# Patient Record
Sex: Female | Born: 1970 | Race: White | Hispanic: No | Marital: Single
Health system: Midwestern US, Academic
[De-identification: ages and names within clinical notes are randomized; demographics above are authoritative.]

---

## 2018-05-13 ENCOUNTER — Inpatient Hospital Stay: Admit: 2018-05-13 | Discharge: 2018-05-13 | Payer: MEDICARE | Primary: Family

## 2018-05-13 ENCOUNTER — Encounter: Admit: 2018-05-13 | Discharge: 2018-05-14 | Primary: Family

## 2018-05-13 ENCOUNTER — Encounter: Admit: 2018-05-13 | Discharge: 2018-05-13 | Payer: MEDICARE | Primary: Family

## 2018-05-13 DIAGNOSIS — N3000 Acute cystitis without hematuria: ICD-10-CM

## 2018-05-13 DIAGNOSIS — N390 Urinary tract infection, site not specified: ICD-10-CM

## 2018-05-13 DIAGNOSIS — F319 Bipolar disorder, unspecified: ICD-10-CM

## 2018-05-13 DIAGNOSIS — I701 Atherosclerosis of renal artery: ICD-10-CM

## 2018-05-13 DIAGNOSIS — R918 Other nonspecific abnormal finding of lung field: Principal | ICD-10-CM

## 2018-05-13 DIAGNOSIS — F329 Major depressive disorder, single episode, unspecified: ICD-10-CM

## 2018-05-13 DIAGNOSIS — I10 Essential (primary) hypertension: ICD-10-CM

## 2018-05-13 DIAGNOSIS — G35 Multiple sclerosis: ICD-10-CM

## 2018-05-13 DIAGNOSIS — E785 Hyperlipidemia, unspecified: ICD-10-CM

## 2018-05-13 MED ORDER — LACTATED RINGERS IV SOLP
1000 mL | INTRAVENOUS | 0 refills | Status: CP
Start: 2018-05-13 — End: ?
  Administered 2018-05-14: 03:00:00 1000 mL via INTRAVENOUS

## 2018-05-13 MED ORDER — SULFAMETHOXAZOLE-TRIMETHOPRIM 800-160 MG PO TAB
1 | Freq: Two times a day (BID) | ORAL | 0 refills | Status: DC
Start: 2018-05-13 — End: 2018-05-14
  Administered 2018-05-14: 04:00:00 1 via ORAL

## 2018-05-13 MED ORDER — NICARDIPINE IN NACL (ISO-OS) 20 MG/200 ML IV PGBK
5-15 mg/h | INTRAVENOUS | 0 refills | Status: DC
Start: 2018-05-13 — End: 2018-05-15
  Administered 2018-05-14 (×3): 5 mg/h via INTRAVENOUS
  Administered 2018-05-14: 16:00:00 7.5 mg/h via INTRAVENOUS
  Administered 2018-05-14 – 2018-05-15 (×3): 5 mg/h via INTRAVENOUS

## 2018-05-13 MED ORDER — ENOXAPARIN 40 MG/0.4 ML SC SYRG
40 mg | Freq: Every day | SUBCUTANEOUS | 0 refills | Status: DC
Start: 2018-05-13 — End: 2018-05-14
  Administered 2018-05-14: 13:00:00 40 mg via SUBCUTANEOUS

## 2018-05-14 DIAGNOSIS — N179 Acute kidney failure, unspecified: Secondary | ICD-10-CM

## 2018-05-14 LAB — CBC AND DIFF
Lab: 11 10*3/uL — ABNORMAL HIGH (ref 4.5–11.0)
Lab: 10 K/UL (ref >60)

## 2018-05-14 LAB — URINALYSIS DIPSTICK REFLEX TO CULTURE: Lab: NEGATIVE K/UL (ref 0–0.45)

## 2018-05-14 LAB — MAGNESIUM
Lab: 1.6 mg/dL — ABNORMAL HIGH (ref 1.6–2.6)
Lab: 1.9 mg/dL — ABNORMAL HIGH (ref 1.6–2.6)

## 2018-05-14 LAB — COMPREHENSIVE METABOLIC PANEL
Lab: 147 MMOL/L (ref 137–147)
Lab: 41 mg/dL — ABNORMAL HIGH (ref 7–25)
Lab: 147 MMOL/L — ABNORMAL HIGH (ref 137–147)

## 2018-05-14 LAB — TROPONIN-I
Lab: 0.3 ng/mL — ABNORMAL HIGH (ref 0.0–0.05)
Lab: 0.3 ng/mL — ABNORMAL HIGH (ref 0.0–0.05)

## 2018-05-14 LAB — URINALYSIS MICROSCOPIC REFLEX TO CULTURE

## 2018-05-14 LAB — CREATINE KINASE-CPK: Lab: 398 U/L — ABNORMAL HIGH (ref 21–215)

## 2018-05-14 LAB — LACTIC ACID (BG - RAPID LACTATE): Lab: 1.7 MMOL/L (ref 0.5–2.0)

## 2018-05-14 MED ORDER — CARVEDILOL 12.5 MG PO TAB
12.5 mg | Freq: Two times a day (BID) | ORAL | 0 refills | Status: DC
Start: 2018-05-14 — End: 2018-05-15
  Administered 2018-05-14: 22:00:00 12.5 mg via ORAL

## 2018-05-14 MED ORDER — DIPHENHYDRAMINE HCL 25 MG PO CAP
25 mg | ORAL | 0 refills | Status: DC | PRN
Start: 2018-05-14 — End: 2018-05-22
  Administered 2018-05-14 – 2018-05-22 (×3): 25 mg via ORAL

## 2018-05-14 MED ORDER — HEPARIN, PORCINE (PF) 5,000 UNIT/0.5 ML IJ SYRG
5000 [IU] | SUBCUTANEOUS | 0 refills | Status: DC
Start: 2018-05-14 — End: 2018-05-22
  Administered 2018-05-14 – 2018-05-21 (×19): 5000 [IU] via SUBCUTANEOUS

## 2018-05-14 MED ORDER — AMLODIPINE 10 MG PO TAB
10 mg | Freq: Every day | ORAL | 0 refills | Status: DC
Start: 2018-05-14 — End: 2018-05-16
  Administered 2018-05-14 – 2018-05-16 (×3): 10 mg via ORAL

## 2018-05-14 MED ORDER — CLONIDINE HCL 0.1 MG PO TAB
.1 mg | ORAL | 0 refills | Status: DC
Start: 2018-05-14 — End: 2018-05-16
  Administered 2018-05-14 – 2018-05-16 (×6): 0.1 mg via ORAL

## 2018-05-14 MED ORDER — SULFAMETHOXAZOLE-TRIMETHOPRIM 400-80 MG PO TAB
1 | Freq: Two times a day (BID) | ORAL | 0 refills | Status: DC
Start: 2018-05-14 — End: 2018-05-15
  Administered 2018-05-15 (×2): 1 via ORAL

## 2018-05-15 DIAGNOSIS — N179 Acute kidney failure, unspecified: Secondary | ICD-10-CM

## 2018-05-15 LAB — CREATINE KINASE-CPK: Lab: 246 U/L — ABNORMAL HIGH (ref 21–215)

## 2018-05-15 LAB — MAGNESIUM: Lab: 2 mg/dL — ABNORMAL LOW (ref 1.6–2.6)

## 2018-05-15 LAB — CBC AND DIFF: Lab: 10 K/UL — ABNORMAL LOW (ref >60)

## 2018-05-15 LAB — COMPREHENSIVE METABOLIC PANEL: Lab: 138 MMOL/L — ABNORMAL LOW (ref >60)

## 2018-05-15 MED ORDER — LOSARTAN 25 MG PO TAB
50 mg | Freq: Once | ORAL | 0 refills | Status: CP
Start: 2018-05-15 — End: ?
  Administered 2018-05-15: 23:00:00 50 mg via ORAL

## 2018-05-15 MED ORDER — LOSARTAN 25 MG PO TAB
50 mg | Freq: Every day | ORAL | 0 refills | Status: DC
Start: 2018-05-15 — End: 2018-05-16
  Administered 2018-05-15: 15:00:00 50 mg via ORAL

## 2018-05-15 MED ORDER — CARVEDILOL 25 MG PO TAB
25 mg | Freq: Two times a day (BID) | ORAL | 0 refills | Status: DC
Start: 2018-05-15 — End: 2018-05-22
  Administered 2018-05-15 – 2018-05-22 (×14): 25 mg via ORAL

## 2018-05-15 MED ORDER — VANCOMYCIN  500 MG IVPB
500 mg | Freq: Once | INTRAVENOUS | 0 refills | Status: CP
Start: 2018-05-15 — End: ?
  Administered 2018-05-15 (×2): 500 mg via INTRAVENOUS

## 2018-05-16 LAB — CBC AND DIFF: Lab: 8.1 K/UL — ABNORMAL LOW (ref 4.5–11.0)

## 2018-05-16 LAB — BASIC METABOLIC PANEL
Lab: 112 MMOL/L — ABNORMAL HIGH (ref 98–110)
Lab: 119 mg/dL — ABNORMAL HIGH (ref >60)
Lab: 139 MMOL/L — ABNORMAL HIGH (ref 137–147)
Lab: 14 mL/min — ABNORMAL LOW (ref >60)
Lab: 19 MMOL/L — ABNORMAL LOW (ref 21–30)
Lab: 3.4 mg/dL — ABNORMAL HIGH (ref 0.4–1.00)
Lab: 4.8 MMOL/L (ref 3.5–5.1)
Lab: 66 mg/dL — ABNORMAL HIGH (ref 7–25)
Lab: 8 mL/min (ref >60)
Lab: 8.8 mg/dL (ref 8.5–10.6)

## 2018-05-16 LAB — COCAINE-URINE RANDOM: Lab: NEGATIVE

## 2018-05-16 LAB — MAGNESIUM: Lab: 2.3 mg/dL (ref 1.6–2.6)

## 2018-05-16 LAB — BENZODIAZEPINES-URINE RANDOM: Lab: NEGATIVE

## 2018-05-16 LAB — OPIATES-URINE RANDOM: Lab: NEGATIVE

## 2018-05-16 LAB — VANCOMYCIN TIMED LEVEL: Lab: 3.7 ug/mL

## 2018-05-16 LAB — AMPHETAMINES-URINE RANDOM: Lab: NEGATIVE

## 2018-05-16 LAB — BARBITURATES-URINE RANDOM: Lab: NEGATIVE — AB

## 2018-05-16 LAB — CANNABINOIDS-URINE RANDOM: Lab: NEGATIVE — AB

## 2018-05-16 LAB — PROLACTIN: Lab: 13 ng/mL (ref 3.3–26.7)

## 2018-05-16 LAB — COMPREHENSIVE METABOLIC PANEL: Lab: 141 MMOL/L (ref 137–147)

## 2018-05-16 LAB — LACTIC ACID(LACTATE): Lab: 0.6 MMOL/L (ref 0.5–2.0)

## 2018-05-16 LAB — PHENCYCLIDINES-URINE RANDOM: Lab: NEGATIVE

## 2018-05-16 MED ORDER — AMLODIPINE 10 MG PO TAB
10 mg | Freq: Two times a day (BID) | ORAL | 0 refills | Status: DC
Start: 2018-05-16 — End: 2018-05-23
  Administered 2018-05-17 – 2018-05-23 (×13): 10 mg via ORAL

## 2018-05-16 MED ORDER — DOXYCYCLINE HYCLATE 100 MG PO TAB
100 mg | Freq: Two times a day (BID) | ORAL | 0 refills | Status: DC
Start: 2018-05-16 — End: 2018-05-16

## 2018-05-16 MED ORDER — POLYETHYLENE GLYCOL 3350 17 GRAM PO PWPK
2 | Freq: Two times a day (BID) | ORAL | 0 refills | Status: DC
Start: 2018-05-16 — End: 2018-05-22
  Administered 2018-05-16 – 2018-05-18 (×3): 34 g via ORAL

## 2018-05-16 MED ORDER — CLONIDINE HCL 0.1 MG PO TAB
.1 mg | Freq: Once | ORAL | 0 refills | Status: CP
Start: 2018-05-16 — End: ?
  Administered 2018-05-16: 15:00:00 0.1 mg via ORAL

## 2018-05-16 MED ORDER — LACTATED RINGERS IV SOLP
1000 mL | INTRAVENOUS | 0 refills | Status: CP
Start: 2018-05-16 — End: ?
  Administered 2018-05-16: 15:00:00 1000 mL via INTRAVENOUS

## 2018-05-16 MED ORDER — NICARDIPINE IN NACL (ISO-OS) 20 MG/200 ML IV PGBK
5-15 mg/h | INTRAVENOUS | 0 refills | Status: DC
Start: 2018-05-16 — End: 2018-05-19
  Administered 2018-05-16 – 2018-05-17 (×7): 10 mg/h via INTRAVENOUS
  Administered 2018-05-17: 15:00:00 5 mg/h via INTRAVENOUS
  Administered 2018-05-17 (×2): 10 mg/h via INTRAVENOUS

## 2018-05-16 MED ORDER — CLONIDINE HCL 0.1 MG PO TAB
.3 mg | ORAL | 0 refills | Status: DC
Start: 2018-05-16 — End: 2018-05-23
  Administered 2018-05-17 – 2018-05-23 (×18): 0.3 mg via ORAL

## 2018-05-16 MED ORDER — SENNOSIDES 8.6 MG PO TAB
1 | Freq: Two times a day (BID) | ORAL | 0 refills | Status: DC
Start: 2018-05-16 — End: 2018-05-24
  Administered 2018-05-16 – 2018-05-24 (×5): 1 via ORAL

## 2018-05-16 MED ORDER — VANCOMYCIN 1G/250ML D5W IVPB (VIAL2BAG)
15 mg/kg | Freq: Once | INTRAVENOUS | 0 refills | Status: CP
Start: 2018-05-16 — End: ?
  Administered 2018-05-16 (×2): 1000 mg via INTRAVENOUS

## 2018-05-16 MED ORDER — VANCOMYCIN RANDOM DOSING
1 | INTRAVENOUS | 0 refills | Status: DC
Start: 2018-05-16 — End: 2018-05-24

## 2018-05-16 MED ORDER — CLONIDINE HCL 0.1 MG PO TAB
.2 mg | ORAL | 0 refills | Status: DC
Start: 2018-05-16 — End: 2018-05-16

## 2018-05-16 MED ORDER — LOSARTAN 25 MG PO TAB
100 mg | Freq: Every day | ORAL | 0 refills | Status: DC
Start: 2018-05-16 — End: 2018-05-22
  Administered 2018-05-16 – 2018-05-21 (×7): 100 mg via ORAL

## 2018-05-16 MED ORDER — PEG-ELECTROLYTE SOLN 420 GRAM PO SOLR
4 L | Freq: Once | ORAL | 0 refills | Status: CP
Start: 2018-05-16 — End: ?
  Administered 2018-05-17: 03:00:00 4 L via ORAL

## 2018-05-16 MED ORDER — VANCOMYCIN PHARMACY TO MANAGE
1 | 0 refills | Status: DC
Start: 2018-05-16 — End: 2018-05-24

## 2018-05-17 LAB — BLOOD GASES, PERIPHERAL VENOUS
Lab: 18 MMOL/L
Lab: 27 mmHg — ABNORMAL LOW (ref 36–50)
Lab: 7.3 (ref 7.30–7.40)
Lab: 7.7 MMOL/L
Lab: 81 mmHg — ABNORMAL HIGH (ref 33–48)
Lab: 96 % — ABNORMAL HIGH (ref 55–71)

## 2018-05-17 LAB — MAGNESIUM: Lab: 2.3 mg/dL — ABNORMAL HIGH (ref >60)

## 2018-05-17 LAB — CBC AND DIFF: Lab: 7.1 K/UL — ABNORMAL LOW (ref 4.5–11.0)

## 2018-05-17 LAB — COMPREHENSIVE METABOLIC PANEL: Lab: 140 MMOL/L — ABNORMAL LOW (ref >60)

## 2018-05-17 MED ORDER — LORAZEPAM 2 MG/ML IJ SOLN
.5-1 mg | Freq: Once | INTRAVENOUS | 0 refills | Status: CP
Start: 2018-05-17 — End: ?

## 2018-05-17 MED ADMIN — LORAZEPAM 2 MG/ML IJ SOLN [10467]: 1 mg | INTRAVENOUS | @ 17:00:00 | Stop: 2018-05-17 | NDC 00641604801

## 2018-05-18 ENCOUNTER — Inpatient Hospital Stay: Admit: 2018-05-18 | Discharge: 2018-05-18 | Payer: MEDICARE | Primary: Family

## 2018-05-18 LAB — BASIC METABOLIC PANEL: Lab: 146 MMOL/L — ABNORMAL LOW (ref 137–147)

## 2018-05-18 LAB — VANCOMYCIN TIMED LEVEL: Lab: 9.8 ug/mL (ref 0.4–1.24)

## 2018-05-18 LAB — PHOSPHORUS: Lab: 4 mg/dL — ABNORMAL LOW (ref 2.0–4.5)

## 2018-05-18 LAB — MAGNESIUM: Lab: 2.3 mg/dL — ABNORMAL LOW (ref 1.6–2.6)

## 2018-05-18 LAB — CBC: Lab: 5.6 K/UL — ABNORMAL LOW (ref 4.5–11.0)

## 2018-05-18 MED ORDER — VANCOMYCIN IN DEXTROSE 5 % 750 MG/150 ML IV PGBK
750 mg | Freq: Once | INTRAVENOUS | 0 refills | Status: CP
Start: 2018-05-18 — End: ?
  Administered 2018-05-18: 15:00:00 750 mg via INTRAVENOUS

## 2018-05-19 LAB — MAGNESIUM: Lab: 1.9 mg/dL — ABNORMAL LOW (ref 1.6–2.6)

## 2018-05-19 LAB — LIPID PROFILE
Lab: 128 mg/dL (ref <150)
Lab: 151 mg/dL — ABNORMAL LOW (ref <200)

## 2018-05-19 LAB — CULTURE-BLOOD W/SENSITIVITY

## 2018-05-19 LAB — CBC: Lab: 6.9 K/UL — ABNORMAL HIGH (ref 4.5–11.0)

## 2018-05-19 LAB — VANCOMYCIN TIMED LEVEL: Lab: 13 ug/mL

## 2018-05-19 LAB — PHOSPHORUS: Lab: 3.4 mg/dL — ABNORMAL LOW (ref 2.0–4.5)

## 2018-05-19 LAB — BASIC METABOLIC PANEL: Lab: 139 MMOL/L — ABNORMAL HIGH (ref 137–147)

## 2018-05-19 MED ORDER — DEXTROSE 5%-0.45% SODIUM CHLORIDE IV SOLP
INTRAVENOUS | 0 refills | Status: DC
Start: 2018-05-19 — End: 2018-05-21
  Administered 2018-05-21: 03:00:00 1000.000 mL via INTRAVENOUS

## 2018-05-19 MED ORDER — FLU VACC QS2019-20 6MOS UP(PF) 60 MCG (15 MCG X 4)/0.5 ML IM SYRG
.5 mL | Freq: Once | INTRAMUSCULAR | 0 refills | Status: CP
Start: 2018-05-19 — End: ?
  Administered 2018-05-19: 23:00:00 0.5 mL via INTRAMUSCULAR

## 2018-05-19 MED ORDER — IMS MIXTURE TEMPLATE
60 mg | Freq: Two times a day (BID) | ORAL | 0 refills | Status: CP
Start: 2018-05-19 — End: ?
  Administered 2018-05-21 (×4): 60 mg via ORAL

## 2018-05-19 MED ORDER — DIPHENHYDRAMINE HCL 25 MG PO CAP
25 mg | Freq: Once | ORAL | 0 refills | Status: CP
Start: 2018-05-19 — End: ?
  Administered 2018-05-21: 13:00:00 25 mg via ORAL

## 2018-05-19 MED ORDER — ATORVASTATIN 40 MG PO TAB
40 mg | Freq: Every evening | ORAL | 0 refills | Status: DC
Start: 2018-05-19 — End: 2018-05-24
  Administered 2018-05-20 – 2018-05-24 (×5): 40 mg via ORAL

## 2018-05-19 MED ORDER — PNEUMOCOCCAL 23-VAL PS VACCINE 25 MCG/0.5 ML IJ SOLN
.5 mL | Freq: Once | INTRAMUSCULAR | 0 refills | Status: DC
Start: 2018-05-19 — End: 2018-05-24

## 2018-05-20 LAB — MAGNESIUM: Lab: 2 mg/dL — ABNORMAL LOW (ref 1.6–2.6)

## 2018-05-20 LAB — VANCOMYCIN TIMED LEVEL: Lab: 10 ug/mL — ABNORMAL LOW (ref 4.0–5.0)

## 2018-05-20 LAB — BASIC METABOLIC PANEL: Lab: 140 MMOL/L — ABNORMAL LOW (ref 137–147)

## 2018-05-20 LAB — PHOSPHORUS: Lab: 3.4 mg/dL — ABNORMAL LOW (ref 2.0–4.5)

## 2018-05-20 LAB — CBC: Lab: 6.5 K/UL — ABNORMAL HIGH (ref 4.5–11.0)

## 2018-05-20 MED ORDER — VANCOMYCIN  500 MG IVPB
500 mg | Freq: Once | INTRAVENOUS | 0 refills | Status: CP
Start: 2018-05-20 — End: ?
  Administered 2018-05-20 (×2): 500 mg via INTRAVENOUS

## 2018-05-21 ENCOUNTER — Encounter: Admit: 2018-05-21 | Discharge: 2018-05-21 | Payer: MEDICARE | Primary: Family

## 2018-05-21 DIAGNOSIS — N179 Acute kidney failure, unspecified: Secondary | ICD-10-CM

## 2018-05-21 LAB — CBC: Lab: 5.3 K/UL — ABNORMAL HIGH (ref 4.5–11.0)

## 2018-05-21 LAB — PHOSPHORUS: Lab: 4.6 mg/dL — ABNORMAL HIGH (ref >60)

## 2018-05-21 LAB — MAGNESIUM: Lab: 2 mg/dL — ABNORMAL LOW (ref 1.6–2.6)

## 2018-05-21 LAB — BASIC METABOLIC PANEL: Lab: 135 MMOL/L — ABNORMAL LOW (ref 137–147)

## 2018-05-21 MED ORDER — ASPIRIN 81 MG PO CHEW
40.5 mg | Freq: Once | ORAL | 0 refills | Status: CP
Start: 2018-05-21 — End: ?
  Administered 2018-05-22: 15:00:00 40.5 mg via ORAL

## 2018-05-21 MED ORDER — DIPHENHYDRAMINE HCL 50 MG PO CAP
50 mg | Freq: Once | ORAL | 0 refills | Status: DC
Start: 2018-05-21 — End: 2018-05-22

## 2018-05-21 MED ORDER — SODIUM CHLORIDE 0.9 % IV SOLP
INTRAVENOUS | 0 refills | Status: DC
Start: 2018-05-21 — End: 2018-05-23
  Administered 2018-05-22: 21:00:00 1000.000 mL via INTRAVENOUS

## 2018-05-21 MED ORDER — GLUCAGON HCL 1 MG IJ SOLR
1 [IU] | Freq: Once | INTRAVENOUS | 0 refills | Status: AC | PRN
Start: 2018-05-21 — End: ?

## 2018-05-21 MED ORDER — ALBUTEROL SULFATE 2.5 MG/0.5 ML IN NEBU
2.5 mg | RESPIRATORY_TRACT | 0 refills | Status: AC | PRN
Start: 2018-05-21 — End: ?

## 2018-05-21 MED ORDER — ASPIRIN 81 MG PO CHEW
81 mg | Freq: Every day | ORAL | 0 refills | Status: DC
Start: 2018-05-21 — End: 2018-05-24
  Administered 2018-05-23 – 2018-05-24 (×2): 81 mg via ORAL

## 2018-05-21 MED ORDER — PREDNISONE 20 MG PO TAB
60 mg | Freq: Two times a day (BID) | ORAL | 0 refills | Status: CP
Start: 2018-05-21 — End: ?
  Administered 2018-05-22 (×4): 60 mg via ORAL

## 2018-05-21 MED ORDER — HELP MEDICATION
Freq: Every day | 0 refills | Status: DC
Start: 2018-05-21 — End: 2018-05-22

## 2018-05-21 MED ORDER — ASPIRIN 81 MG PO CHEW
20.25 mg | Freq: Once | ORAL | 0 refills | Status: CP
Start: 2018-05-21 — End: ?
  Administered 2018-05-22: 13:00:00 20.25 mg via ORAL

## 2018-05-21 MED ORDER — FAMOTIDINE 20 MG PO TAB
20 mg | Freq: Once | ORAL | 0 refills | Status: DC
Start: 2018-05-21 — End: 2018-05-21

## 2018-05-21 MED ORDER — DIPHENHYDRAMINE HCL 50 MG/ML IJ SOLN
25 mg | INTRAVENOUS | 0 refills | Status: AC | PRN
Start: 2018-05-21 — End: ?

## 2018-05-21 MED ORDER — FAMOTIDINE 20 MG PO TAB
20 mg | Freq: Once | ORAL | 0 refills | Status: CP
Start: 2018-05-21 — End: ?
  Administered 2018-05-22: 13:00:00 20 mg via ORAL

## 2018-05-21 MED ORDER — ASPIRIN 81 MG PO CHEW
40.5 mg | Freq: Once | ORAL | 0 refills | Status: CP
Start: 2018-05-21 — End: ?
  Administered 2018-05-22: 16:00:00 40.5 mg via ORAL

## 2018-05-21 MED ORDER — SODIUM CHLORIDE 0.9 % IV SOLP
250 mL | Freq: Once | INTRAVENOUS | 0 refills | Status: CP
Start: 2018-05-21 — End: ?
  Administered 2018-05-22: 12:00:00 250 mL via INTRAVENOUS

## 2018-05-21 MED ORDER — EPINEPHRINE HCL (PF) 1 MG/ML (1 ML) IJ SOLN
.3-.5 mg | INTRAMUSCULAR | 0 refills | Status: AC | PRN
Start: 2018-05-21 — End: ?

## 2018-05-22 ENCOUNTER — Encounter: Admit: 2018-05-22 | Discharge: 2018-05-22 | Payer: MEDICARE | Primary: Family

## 2018-05-22 DIAGNOSIS — G35 Multiple sclerosis: ICD-10-CM

## 2018-05-22 DIAGNOSIS — E785 Hyperlipidemia, unspecified: ICD-10-CM

## 2018-05-22 DIAGNOSIS — R918 Other nonspecific abnormal finding of lung field: Principal | ICD-10-CM

## 2018-05-22 DIAGNOSIS — N390 Urinary tract infection, site not specified: ICD-10-CM

## 2018-05-22 DIAGNOSIS — F329 Major depressive disorder, single episode, unspecified: ICD-10-CM

## 2018-05-22 DIAGNOSIS — I10 Essential (primary) hypertension: ICD-10-CM

## 2018-05-22 DIAGNOSIS — I701 Atherosclerosis of renal artery: ICD-10-CM

## 2018-05-22 DIAGNOSIS — F319 Bipolar disorder, unspecified: ICD-10-CM

## 2018-05-22 LAB — VANCOMYCIN TIMED LEVEL: Lab: 8.1 ug/mL — ABNORMAL LOW (ref 12.0–15.0)

## 2018-05-22 LAB — AEROBIC BACTERIA, SUSCEPT

## 2018-05-22 LAB — BASIC METABOLIC PANEL: Lab: 134 MMOL/L — ABNORMAL LOW (ref 137–147)

## 2018-05-22 LAB — PREGNANCY TEST-URINE
Lab: 1
Lab: NEGATIVE

## 2018-05-22 LAB — BETA-HCG: Lab: 10 U/L — ABNORMAL HIGH (ref <5)

## 2018-05-22 LAB — CBC
Lab: 3.8 M/UL — ABNORMAL LOW (ref 4.0–5.0)
Lab: 9.3 K/UL — ABNORMAL HIGH (ref 4.5–11.0)

## 2018-05-22 LAB — PHOSPHORUS: Lab: 4.8 mg/dL — ABNORMAL HIGH (ref >60)

## 2018-05-22 LAB — MAGNESIUM: Lab: 2 mg/dL — ABNORMAL LOW (ref 1.6–2.6)

## 2018-05-22 MED ORDER — OXYBUTYNIN CHLORIDE 5 MG PO TAB
5 mg | Freq: Three times a day (TID) | ORAL | 0 refills | Status: DC
Start: 2018-05-22 — End: 2018-05-24
  Administered 2018-05-23 – 2018-05-24 (×5): 5 mg via ORAL

## 2018-05-22 MED ORDER — CLOPIDOGREL 75 MG PO TAB
75 mg | Freq: Every day | ORAL | 0 refills | Status: DC
Start: 2018-05-22 — End: 2018-05-24
  Administered 2018-05-23 – 2018-05-24 (×2): 75 mg via ORAL

## 2018-05-22 MED ORDER — ACETAMINOPHEN 325 MG PO TAB
650 mg | ORAL | 0 refills | Status: DC | PRN
Start: 2018-05-22 — End: 2018-05-24
  Administered 2018-05-23: 17:00:00 650 mg via ORAL

## 2018-05-22 MED ORDER — ASPIRIN 81 MG PO CHEW
81 mg | Freq: Once | ORAL | 0 refills | Status: CP
Start: 2018-05-22 — End: ?
  Administered 2018-05-22: 19:00:00 81 mg via ORAL

## 2018-05-22 MED ORDER — DIPHENHYDRAMINE HCL 50 MG PO CAP
50 mg | Freq: Once | ORAL | 0 refills | Status: CP
Start: 2018-05-22 — End: ?
  Administered 2018-05-22: 22:00:00 50 mg via ORAL

## 2018-05-22 MED ORDER — CARVEDILOL 12.5 MG PO TAB
25 mg | Freq: Two times a day (BID) | ORAL | 0 refills | Status: DC
Start: 2018-05-22 — End: 2018-05-23
  Administered 2018-05-23 (×3): 25 mg via ORAL

## 2018-05-22 MED ORDER — POLYETHYLENE GLYCOL 3350 17 GRAM PO PWPK
2 | Freq: Two times a day (BID) | ORAL | 0 refills | Status: DC | PRN
Start: 2018-05-22 — End: 2018-05-24

## 2018-05-22 MED ORDER — VANCOMYCIN IN DEXTROSE 5 % 750 MG/150 ML IV PGBK
750 mg | Freq: Once | INTRAVENOUS | 0 refills | Status: CP
Start: 2018-05-22 — End: ?
  Administered 2018-05-22: 15:00:00 750 mg via INTRAVENOUS

## 2018-05-22 MED ORDER — ONDANSETRON HCL (PF) 4 MG/2 ML IJ SOLN
4 mg | INTRAVENOUS | 0 refills | Status: DC | PRN
Start: 2018-05-22 — End: 2018-05-24
  Administered 2018-05-23: 10:00:00 4 mg via INTRAVENOUS

## 2018-05-22 MED ORDER — LOSARTAN 50 MG PO TAB
100 mg | Freq: Every day | ORAL | 0 refills | Status: DC
Start: 2018-05-22 — End: 2018-05-23
  Administered 2018-05-23: 15:00:00 100 mg via ORAL

## 2018-05-23 LAB — COMPREHENSIVE METABOLIC PANEL
Lab: 139 MMOL/L — ABNORMAL LOW (ref >60)
Lab: 2.4 mg/dL — ABNORMAL HIGH (ref 0.4–1.00)

## 2018-05-23 LAB — MAGNESIUM: Lab: 2.1 mg/dL — ABNORMAL LOW (ref >60)

## 2018-05-23 LAB — POC ACTIVATED CLOTTING TIME
Lab: 163 s
Lab: 181 s
Lab: 189 s

## 2018-05-23 LAB — CBC: Lab: 11 K/UL — ABNORMAL HIGH (ref 4.5–11.0)

## 2018-05-23 LAB — PHOSPHORUS: Lab: 3.9 mg/dL — ABNORMAL LOW (ref 2.0–4.5)

## 2018-05-23 MED ORDER — ASPIRIN 81 MG PO CHEW
81 mg | ORAL_TABLET | Freq: Every day | ORAL | 3 refills | Status: CN
Start: 2018-05-23 — End: ?

## 2018-05-23 MED ORDER — LOSARTAN 100 MG PO TAB
100 mg | ORAL_TABLET | Freq: Every day | ORAL | 3 refills | 30.00000 days | Status: AC
Start: 2018-05-23 — End: 2018-05-24

## 2018-05-23 MED ORDER — CLONIDINE HCL 0.2 MG PO TAB
.2 mg | ORAL_TABLET | Freq: Three times a day (TID) | ORAL | 3 refills | 30.00000 days | Status: AC
Start: 2018-05-23 — End: 2018-05-24

## 2018-05-23 MED ORDER — CLONIDINE HCL 0.2 MG PO TAB
.2 mg | ORAL | 0 refills | Status: DC
Start: 2018-05-23 — End: 2018-05-24
  Administered 2018-05-23 – 2018-05-24 (×3): 0.2 mg via ORAL

## 2018-05-23 MED ORDER — ATORVASTATIN 40 MG PO TAB
40 mg | ORAL_TABLET | Freq: Every evening | ORAL | 3 refills | Status: CN
Start: 2018-05-23 — End: ?

## 2018-05-23 MED ORDER — CLOPIDOGREL 75 MG PO TAB
75 mg | ORAL_TABLET | Freq: Every day | ORAL | 0 refills | 30.00000 days | Status: AC
Start: 2018-05-23 — End: 2018-05-24

## 2018-05-23 MED ORDER — AMLODIPINE 10 MG PO TAB
10 mg | Freq: Every day | ORAL | 0 refills | Status: DC
Start: 2018-05-23 — End: 2018-05-23
  Administered 2018-05-23: 15:00:00 10 mg via ORAL

## 2018-05-23 MED ORDER — ATORVASTATIN 40 MG PO TAB
40 mg | ORAL_TABLET | Freq: Every evening | ORAL | 3 refills | 90.00000 days | Status: AC
Start: 2018-05-23 — End: 2018-05-24

## 2018-05-23 MED ORDER — AMLODIPINE 10 MG PO TAB
10 mg | ORAL_TABLET | Freq: Every day | ORAL | 3 refills | Status: CN
Start: 2018-05-23 — End: ?

## 2018-05-23 MED ORDER — POLYETHYLENE GLYCOL 3350 17 GRAM PO PWPK
34 g | Freq: Two times a day (BID) | ORAL | 0 refills | 18.00000 days | Status: AC
Start: 2018-05-23 — End: 2018-05-24

## 2018-05-23 MED ORDER — ASPIRIN 81 MG PO TBEC
81 mg | ORAL_TABLET | Freq: Every day | ORAL | 3 refills | Status: AC
Start: 2018-05-23 — End: ?

## 2018-05-24 ENCOUNTER — Inpatient Hospital Stay: Admit: 2018-05-15 | Discharge: 2018-05-15 | Payer: MEDICARE | Primary: Family

## 2018-05-24 ENCOUNTER — Inpatient Hospital Stay: Admit: 2018-05-16 | Discharge: 2018-05-16 | Payer: MEDICARE | Primary: Family

## 2018-05-24 ENCOUNTER — Inpatient Hospital Stay: Admit: 2018-05-21 | Discharge: 2018-05-21 | Payer: MEDICARE | Primary: Family

## 2018-05-24 ENCOUNTER — Inpatient Hospital Stay: Admit: 2018-05-13 | Discharge: 2018-05-13 | Payer: MEDICARE | Primary: Family

## 2018-05-24 ENCOUNTER — Inpatient Hospital Stay
Admit: 2018-05-14 | Discharge: 2018-05-24 | Disposition: A | Discharge status: Home / Self Care | Payer: MEDICARE | Source: Other Acute Inpatient Hospital

## 2018-05-24 ENCOUNTER — Inpatient Hospital Stay: Admit: 2018-05-14 | Discharge: 2018-05-14 | Payer: MEDICARE | Primary: Family

## 2018-05-24 ENCOUNTER — Inpatient Hospital Stay: Admit: 2018-05-17 | Discharge: 2018-05-17 | Payer: MEDICARE | Primary: Family

## 2018-05-24 DIAGNOSIS — I130 Hypertensive heart and chronic kidney disease with heart failure and stage 1 through stage 4 chronic kidney disease, or unspecified chronic kidney disease: ICD-10-CM

## 2018-05-24 DIAGNOSIS — K5900 Constipation, unspecified: ICD-10-CM

## 2018-05-24 DIAGNOSIS — N390 Urinary tract infection, site not specified: ICD-10-CM

## 2018-05-24 DIAGNOSIS — E872 Acidosis: ICD-10-CM

## 2018-05-24 DIAGNOSIS — F319 Bipolar disorder, unspecified: ICD-10-CM

## 2018-05-24 DIAGNOSIS — N179 Acute kidney failure, unspecified: ICD-10-CM

## 2018-05-24 DIAGNOSIS — Z87891 Personal history of nicotine dependence: ICD-10-CM

## 2018-05-24 DIAGNOSIS — Z87440 Personal history of urinary (tract) infections: ICD-10-CM

## 2018-05-24 DIAGNOSIS — E785 Hyperlipidemia, unspecified: ICD-10-CM

## 2018-05-24 DIAGNOSIS — Z23 Encounter for immunization: ICD-10-CM

## 2018-05-24 DIAGNOSIS — G35 Multiple sclerosis: ICD-10-CM

## 2018-05-24 DIAGNOSIS — I701 Atherosclerosis of renal artery: ICD-10-CM

## 2018-05-24 DIAGNOSIS — N184 Chronic kidney disease, stage 4 (severe): ICD-10-CM

## 2018-05-24 DIAGNOSIS — N281 Cyst of kidney, acquired: ICD-10-CM

## 2018-05-24 DIAGNOSIS — E860 Dehydration: ICD-10-CM

## 2018-05-24 DIAGNOSIS — Z9114 Patient's other noncompliance with medication regimen: ICD-10-CM

## 2018-05-24 DIAGNOSIS — R197 Diarrhea, unspecified: ICD-10-CM

## 2018-05-24 DIAGNOSIS — I674 Hypertensive encephalopathy: ICD-10-CM

## 2018-05-24 DIAGNOSIS — I129 Hypertensive chronic kidney disease with stage 1 through stage 4 chronic kidney disease, or unspecified chronic kidney disease: ICD-10-CM

## 2018-05-24 DIAGNOSIS — I161 Hypertensive emergency: Principal | ICD-10-CM

## 2018-05-24 LAB — COMPREHENSIVE METABOLIC PANEL: Lab: 136 MMOL/L — ABNORMAL LOW (ref 137–147)

## 2018-05-24 MED ORDER — CLONIDINE HCL 0.1 MG PO TAB
ORAL_TABLET | Freq: Three times a day (TID) | ORAL | 0 refills | 30.00000 days | Status: AC
Start: 2018-05-24 — End: 2018-05-24

## 2018-05-24 MED ORDER — CLONIDINE HCL 0.1 MG PO TAB
ORAL_TABLET | Freq: Two times a day (BID) | 0 refills | Status: SS
Start: 2018-05-24 — End: 2018-06-05

## 2018-05-24 MED ORDER — CLOPIDOGREL 75 MG PO TAB
75 mg | ORAL_TABLET | Freq: Every day | ORAL | 0 refills | Status: SS
Start: 2018-05-24 — End: 2018-06-20

## 2018-05-24 MED ORDER — POLYETHYLENE GLYCOL 3350 17 GRAM PO PWPK
34 g | Freq: Two times a day (BID) | ORAL | 0 refills | Status: SS
Start: 2018-05-24 — End: 2018-06-05

## 2018-05-24 MED ORDER — ATORVASTATIN 40 MG PO TAB
40 mg | ORAL_TABLET | Freq: Every evening | ORAL | 3 refills | 90.00000 days | Status: AC
Start: 2018-05-24 — End: ?

## 2018-05-25 LAB — CULTURE-URINE W/SENSITIVITY: Lab: >10

## 2018-05-28 ENCOUNTER — Encounter: Admit: 2018-05-28 | Discharge: 2018-05-28 | Payer: MEDICARE | Primary: Family

## 2018-05-29 ENCOUNTER — Encounter: Admit: 2018-05-29 | Discharge: 2018-05-29 | Payer: MEDICARE | Primary: Family

## 2018-05-29 ENCOUNTER — Ambulatory Visit: Admit: 2018-05-29 | Discharge: 2018-05-30 | Payer: MEDICARE | Primary: Family

## 2018-05-29 DIAGNOSIS — I150 Renovascular hypertension: Principal | ICD-10-CM

## 2018-05-29 DIAGNOSIS — I10 Essential (primary) hypertension: ICD-10-CM

## 2018-05-29 DIAGNOSIS — N184 Chronic kidney disease, stage 4 (severe): ICD-10-CM

## 2018-05-29 DIAGNOSIS — E785 Hyperlipidemia, unspecified: ICD-10-CM

## 2018-05-29 DIAGNOSIS — I701 Atherosclerosis of renal artery: ICD-10-CM

## 2018-05-29 DIAGNOSIS — F329 Major depressive disorder, single episode, unspecified: ICD-10-CM

## 2018-05-29 DIAGNOSIS — G35 Multiple sclerosis: ICD-10-CM

## 2018-05-29 DIAGNOSIS — R918 Other nonspecific abnormal finding of lung field: Principal | ICD-10-CM

## 2018-05-29 DIAGNOSIS — F319 Bipolar disorder, unspecified: ICD-10-CM

## 2018-05-29 DIAGNOSIS — N390 Urinary tract infection, site not specified: ICD-10-CM

## 2018-05-29 DIAGNOSIS — I161 Hypertensive emergency: ICD-10-CM

## 2018-05-29 MED ORDER — AMLODIPINE 5 MG PO TAB
5 mg | ORAL_TABLET | Freq: Every day | ORAL | 3 refills | Status: AC
Start: 2018-05-29 — End: 2018-06-21

## 2018-06-04 ENCOUNTER — Encounter: Admit: 2018-06-04 | Discharge: 2018-06-04 | Payer: MEDICARE | Primary: Family

## 2018-06-04 DIAGNOSIS — Z9911 Dependence on respirator [ventilator] status: ICD-10-CM

## 2018-06-04 DIAGNOSIS — R578 Other shock: ICD-10-CM

## 2018-06-04 DIAGNOSIS — I25118 Atherosclerotic heart disease of native coronary artery with other forms of angina pectoris: ICD-10-CM

## 2018-06-04 DIAGNOSIS — Z951 Presence of aortocoronary bypass graft: ICD-10-CM

## 2018-06-04 DIAGNOSIS — I161 Hypertensive emergency: Secondary | ICD-10-CM

## 2018-06-04 LAB — PROTIME INR (PT): Lab: 1 g/dL — ABNORMAL HIGH (ref 0.8–1.2)

## 2018-06-04 LAB — PTT (APTT): Lab: 30 s — ABNORMAL HIGH (ref 24.0–36.5)

## 2018-06-04 LAB — MAGNESIUM: Lab: 1.7 mg/dL (ref 1.6–2.6)

## 2018-06-04 LAB — CBC AND DIFF
Lab: 10 10*3/uL (ref 4.5–11.0)
Lab: 28 pg (ref 26–34)
Lab: 4.1 M/UL (ref 4.0–5.0)

## 2018-06-04 LAB — POC LACTATE: Lab: 1.1 MMOL/L (ref 0.5–2.0)

## 2018-06-04 LAB — POC TROPONIN: Lab: 0 ng/mL (ref 0.00–0.05)

## 2018-06-04 LAB — URINALYSIS DIPSTICK
Lab: NEGATIVE % (ref 40–50)
Lab: NEGATIVE K/UL (ref 0–0.20)
Lab: NEGATIVE mL/min — ABNORMAL LOW (ref 0–0.45)
Lab: NEGATIVE mL/min — ABNORMAL LOW (ref 0–0.80)

## 2018-06-04 LAB — URINALYSIS, MICROSCOPIC

## 2018-06-04 LAB — COMPREHENSIVE METABOLIC PANEL
Lab: 0.4 mg/dL (ref 0.3–1.2)
Lab: 102 MMOL/L (ref 98–110)
Lab: 14 K/UL — ABNORMAL HIGH (ref 3–12)
Lab: 141 MMOL/L — ABNORMAL LOW (ref 137–147)
Lab: 31 U/L — AB (ref 7–56)
Lab: 4 MMOL/L — ABNORMAL LOW (ref 3.5–5.1)

## 2018-06-04 LAB — D-DIMER: Lab: 208 ng{FEU}/mL — ABNORMAL HIGH (ref <500)

## 2018-06-04 LAB — BNP POC ER: Lab: 552 pg/mL — ABNORMAL HIGH (ref >=60)

## 2018-06-04 LAB — PHOSPHORUS: Lab: 3.9 mg/dL (ref 2.0–4.5)

## 2018-06-04 MED ORDER — SODIUM CHLORIDE 0.9 % IV SOLP
500 mL | INTRAVENOUS | 0 refills | Status: CP
Start: 2018-06-04 — End: ?
  Administered 2018-06-04: 21:00:00 500 mL via INTRAVENOUS

## 2018-06-04 MED ORDER — ESMOLOL IN NACL (ISO-OSM) 2,500 MG/250 ML (10 MG/ML) IV SOLP
25-300 ug/kg/min | INTRAVENOUS | 0 refills | Status: DC
Start: 2018-06-04 — End: 2018-06-05
  Administered 2018-06-04: 23:00:00 25 ug/kg/min via INTRAVENOUS

## 2018-06-04 MED ORDER — ONDANSETRON HCL (PF) 4 MG/2 ML IJ SOLN
4 mg | Freq: Once | INTRAVENOUS | 0 refills | Status: CP
Start: 2018-06-04 — End: ?
  Administered 2018-06-04: 22:00:00 4 mg via INTRAVENOUS

## 2018-06-04 MED ORDER — NICARDIPINE IN NACL (ISO-OS) 40 MG/200 ML IV PGBK
5-15 mg/h | INTRAVENOUS | 0 refills | Status: DC
Start: 2018-06-04 — End: 2018-06-04
  Administered 2018-06-04: 21:00:00 5 mg/h via INTRAVENOUS

## 2018-06-04 MED ORDER — NICARDIPINE IN NACL (ISO-OS) 20 MG/200 ML IV PGBK
5-15 mg/h | INTRAVENOUS | 0 refills | Status: DC
Start: 2018-06-04 — End: 2018-06-04

## 2018-06-04 MED ORDER — ASPIRIN 81 MG PO CHEW
324 mg | Freq: Once | ORAL | 0 refills | Status: CP
Start: 2018-06-04 — End: ?
  Administered 2018-06-04: 21:00:00 324 mg via ORAL

## 2018-06-05 ENCOUNTER — Encounter: Admit: 2018-06-05 | Discharge: 2018-06-05 | Payer: MEDICARE | Primary: Family

## 2018-06-05 DIAGNOSIS — F329 Major depressive disorder, single episode, unspecified: ICD-10-CM

## 2018-06-05 DIAGNOSIS — N390 Urinary tract infection, site not specified: ICD-10-CM

## 2018-06-05 DIAGNOSIS — G35 Multiple sclerosis: ICD-10-CM

## 2018-06-05 DIAGNOSIS — I701 Atherosclerosis of renal artery: ICD-10-CM

## 2018-06-05 DIAGNOSIS — K5792 Diverticulitis of intestine, part unspecified, without perforation or abscess without bleeding: ICD-10-CM

## 2018-06-05 DIAGNOSIS — E785 Hyperlipidemia, unspecified: ICD-10-CM

## 2018-06-05 DIAGNOSIS — I10 Essential (primary) hypertension: ICD-10-CM

## 2018-06-05 DIAGNOSIS — F319 Bipolar disorder, unspecified: ICD-10-CM

## 2018-06-05 DIAGNOSIS — R918 Other nonspecific abnormal finding of lung field: Principal | ICD-10-CM

## 2018-06-05 LAB — BASIC METABOLIC PANEL
Lab: 1.9 mg/dL — ABNORMAL HIGH (ref 0.4–1.00)
Lab: 104 MMOL/L — ABNORMAL LOW (ref 98–110)
Lab: 107 mg/dL — ABNORMAL HIGH (ref 70–100)
Lab: 13 pg — ABNORMAL HIGH (ref 3–12)
Lab: 143 MMOL/L — ABNORMAL LOW (ref 137–147)
Lab: 26 MMOL/L (ref 21–30)
Lab: 27 mL/min — ABNORMAL LOW (ref >60)
Lab: 33 mL/min — ABNORMAL LOW (ref >60)
Lab: 33 mg/dL — ABNORMAL HIGH (ref 7–25)
Lab: 4 MMOL/L — ABNORMAL LOW (ref 3.5–5.1)
Lab: 9.4 mg/dL (ref 8.5–10.6)

## 2018-06-05 LAB — TROPONIN-I
Lab: 0.5 ng/mL — ABNORMAL HIGH (ref 0.0–0.05)
Lab: 3.1 ng/mL — ABNORMAL HIGH (ref <100)
Lab: 2.8 ng/mL — ABNORMAL HIGH (ref 0.0–0.05)

## 2018-06-05 LAB — CBC: Lab: 8.6 10*3/uL (ref 4.5–11.0)

## 2018-06-05 LAB — PTT (APTT): Lab: 28 s (ref 24.0–36.5)

## 2018-06-05 LAB — BETA-HCG: Lab: 4 U/L (ref <5)

## 2018-06-05 MED ORDER — METHYLPREDNISOLONE SOD SUC(PF) 125 MG/2 ML IJ SOLR
125 mg | Freq: Once | INTRAVENOUS | 0 refills | Status: CP
Start: 2018-06-05 — End: ?
  Administered 2018-06-05: 17:00:00 125 mg via INTRAVENOUS

## 2018-06-05 MED ORDER — AMLODIPINE 10 MG PO TAB
10 mg | Freq: Every day | ORAL | 0 refills | Status: DC
Start: 2018-06-05 — End: 2018-06-11
  Administered 2018-06-06 – 2018-06-11 (×6): 10 mg via ORAL

## 2018-06-05 MED ORDER — ASPIRIN 81 MG PO TBEC
81 mg | Freq: Every day | ORAL | 0 refills | Status: DC
Start: 2018-06-05 — End: 2018-06-10
  Administered 2018-06-05 – 2018-06-10 (×6): 81 mg via ORAL

## 2018-06-05 MED ORDER — HEPARIN, PORCINE (PF) 5,000 UNIT/0.5 ML IJ SYRG
5000 [IU] | SUBCUTANEOUS | 0 refills | Status: DC
Start: 2018-06-05 — End: 2018-06-05
  Administered 2018-06-05: 07:00:00 5000 [IU] via SUBCUTANEOUS

## 2018-06-05 MED ORDER — DIPHENHYDRAMINE HCL 25 MG PO CAP
25 mg | ORAL | 0 refills | Status: DC | PRN
Start: 2018-06-05 — End: 2018-06-06

## 2018-06-05 MED ORDER — SENNOSIDES 8.6 MG PO TAB
2 | Freq: Two times a day (BID) | ORAL | 0 refills | Status: DC
Start: 2018-06-05 — End: 2018-06-11
  Administered 2018-06-05 – 2018-06-10 (×5): 2 via ORAL

## 2018-06-05 MED ORDER — POLYETHYLENE GLYCOL 3350 17 GRAM PO PWPK
34 g | Freq: Two times a day (BID) | ORAL | 0 refills | Status: DC
Start: 2018-06-05 — End: 2018-06-12
  Administered 2018-06-12: 01:00:00 34 g via ORAL

## 2018-06-05 MED ORDER — HEPARIN (PORCINE) BOLUS FOR CONTINUOUS INF (BAG)
20-40 [IU]/kg | INTRAVENOUS | 0 refills | Status: DC
Start: 2018-06-05 — End: 2018-06-11

## 2018-06-05 MED ORDER — ONDANSETRON HCL (PF) 4 MG/2 ML IJ SOLN
4 mg | INTRAVENOUS | 0 refills | Status: DC | PRN
Start: 2018-06-05 — End: 2018-06-11

## 2018-06-05 MED ORDER — SODIUM CHLORIDE 0.9 % IV SOLP
INTRAVENOUS | 0 refills | Status: AC
Start: 2018-06-05 — End: ?

## 2018-06-05 MED ORDER — FAMOTIDINE 20 MG PO TAB
20 mg | Freq: Once | ORAL | 0 refills | Status: CP
Start: 2018-06-05 — End: ?
  Administered 2018-06-05: 17:00:00 20 mg via ORAL

## 2018-06-05 MED ORDER — MONTELUKAST 10 MG PO TAB
10 mg | Freq: Every evening | ORAL | 0 refills | Status: DC
Start: 2018-06-05 — End: 2018-06-21
  Administered 2018-06-05 – 2018-06-21 (×17): 10 mg via ORAL

## 2018-06-05 MED ORDER — ERGOCALCIFEROL (VITAMIN D2) 50,000 UNIT PO CAP
50000 [IU] | ORAL | 0 refills | Status: DC
Start: 2018-06-05 — End: 2018-06-11
  Administered 2018-06-08: 13:00:00 50000 [IU] via ORAL

## 2018-06-05 MED ORDER — SODIUM CHLORIDE 0.9 % IV SOLP
250 mL | INTRAVENOUS | 0 refills | Status: CP
Start: 2018-06-05 — End: ?
  Administered 2018-06-05: 19:00:00 250.000 mL via INTRAVENOUS

## 2018-06-05 MED ORDER — CARVEDILOL 12.5 MG PO TAB
12.5 mg | Freq: Two times a day (BID) | ORAL | 0 refills | Status: DC
Start: 2018-06-05 — End: 2018-06-06
  Administered 2018-06-05 – 2018-06-06 (×4): 12.5 mg via ORAL

## 2018-06-05 MED ORDER — CYCLOBENZAPRINE 10 MG PO TAB
10 mg | Freq: Three times a day (TID) | ORAL | 0 refills | Status: DC | PRN
Start: 2018-06-05 — End: 2018-06-11
  Administered 2018-06-05 – 2018-06-11 (×4): 10 mg via ORAL

## 2018-06-05 MED ORDER — ALUMINUM-MAGNESIUM HYDROXIDE 200-200 MG/5 ML PO SUSP
30 mL | ORAL | 0 refills | Status: DC | PRN
Start: 2018-06-05 — End: 2018-06-11
  Administered 2018-06-06 (×2): 30 mL via ORAL

## 2018-06-05 MED ORDER — HEPARIN (PORCINE) BOLUS FOR CONTINUOUS INF (BAG)
60 [IU]/kg | Freq: Once | INTRAVENOUS | 0 refills | Status: CP
Start: 2018-06-05 — End: ?

## 2018-06-05 MED ORDER — ACETAMINOPHEN 325 MG PO TAB
650 mg | ORAL | 0 refills | Status: DC | PRN
Start: 2018-06-05 — End: 2018-06-11
  Administered 2018-06-06: 15:00:00 650 mg via ORAL

## 2018-06-05 MED ORDER — ATORVASTATIN 40 MG PO TAB
40 mg | Freq: Every evening | ORAL | 0 refills | Status: DC
Start: 2018-06-05 — End: 2018-06-21
  Administered 2018-06-05 – 2018-06-21 (×17): 40 mg via ORAL

## 2018-06-05 MED ORDER — DIPHENHYDRAMINE HCL 50 MG PO CAP
50 mg | Freq: Once | ORAL | 0 refills | Status: CP
Start: 2018-06-05 — End: ?
  Administered 2018-06-05: 17:00:00 50 mg via ORAL

## 2018-06-05 MED ORDER — RP DX XE-133 XENON MCI
22 | Freq: Once | RESPIRATORY_TRACT | 0 refills | Status: CP
Start: 2018-06-05 — End: ?
  Administered 2018-06-05: 14:00:00 21.8 via RESPIRATORY_TRACT

## 2018-06-05 MED ORDER — CLOPIDOGREL 75 MG PO TAB
75 mg | Freq: Every day | ORAL | 0 refills | Status: DC
Start: 2018-06-05 — End: 2018-06-06
  Administered 2018-06-05 – 2018-06-06 (×2): 75 mg via ORAL

## 2018-06-05 MED ORDER — FLUTICASONE PROPIONATE 50 MCG/ACTUATION NA SPSN
2 | Freq: Every day | NASAL | 0 refills | Status: DC
Start: 2018-06-05 — End: 2018-06-21
  Administered 2018-06-07 – 2018-06-19 (×3): 2 via NASAL

## 2018-06-05 MED ORDER — HEPARIN (PORCINE) IN 5 % DEX 20,000 UNIT/500 ML (40 UNIT/ML) IV SOLP
0-2000 [IU]/h | INTRAVENOUS | 0 refills | Status: DC
Start: 2018-06-05 — End: 2018-06-11
  Administered 2018-06-05: 12:00:00 680.4 [IU]/h via INTRAVENOUS
  Administered 2018-06-06: 06:00:00 880 [IU]/h via INTRAVENOUS
  Administered 2018-06-06 – 2018-06-10 (×6): 980 [IU]/h via INTRAVENOUS
  Administered 2018-06-11: 11:00:00 780 [IU]/h via INTRAVENOUS

## 2018-06-05 MED ORDER — ASPIRIN 81 MG PO CHEW
243 mg | Freq: Once | ORAL | 0 refills | Status: CP
Start: 2018-06-05 — End: ?
  Administered 2018-06-05: 17:00:00 243 mg via ORAL

## 2018-06-05 MED ORDER — SODIUM CHLORIDE 0.9 % IV SOLP
1000 mL | INTRAVENOUS | 0 refills | Status: DC
Start: 2018-06-05 — End: 2018-06-05

## 2018-06-05 MED ORDER — AMLODIPINE 5 MG PO TAB
5 mg | Freq: Every day | ORAL | 0 refills | Status: DC
Start: 2018-06-05 — End: 2018-06-05

## 2018-06-05 MED ORDER — RP DX TC-99M MAA MCI
5 | Freq: Once | INTRAVENOUS | 0 refills | Status: CP
Start: 2018-06-05 — End: ?
  Administered 2018-06-05: 14:00:00 4.7 via INTRAVENOUS

## 2018-06-05 MED ORDER — DIPHENHYDRAMINE HCL 50 MG/ML IJ SOLN
25 mg | INTRAVENOUS | 0 refills | Status: DC | PRN
Start: 2018-06-05 — End: 2018-06-06

## 2018-06-05 MED ORDER — OXYBUTYNIN CHLORIDE 5 MG PO TAB
5 mg | Freq: Three times a day (TID) | ORAL | 0 refills | Status: DC | PRN
Start: 2018-06-05 — End: 2018-06-21
  Administered 2018-06-09: 13:00:00 5 mg via ORAL

## 2018-06-05 MED ORDER — AMLODIPINE 5 MG PO TAB
5 mg | Freq: Every day | ORAL | 0 refills | Status: DC
Start: 2018-06-05 — End: 2018-06-05
  Administered 2018-06-05: 13:00:00 5 mg via ORAL

## 2018-06-06 DIAGNOSIS — I161 Hypertensive emergency: Secondary | ICD-10-CM

## 2018-06-06 LAB — PTT (APTT)
Lab: 24 s (ref 24.0–36.5)
Lab: 37 s — ABNORMAL HIGH (ref 24.0–36.5)
Lab: 60 s — ABNORMAL HIGH (ref 24.0–36.5)

## 2018-06-06 LAB — CBC AND DIFF: Lab: 10 K/UL — ABNORMAL HIGH (ref 4.5–11.0)

## 2018-06-06 LAB — POC ACTIVATED CLOTTING TIME: Lab: 162 s

## 2018-06-06 LAB — MAGNESIUM: Lab: 2 mg/dL — ABNORMAL LOW (ref >60)

## 2018-06-06 MED ORDER — PERFLUTREN LIPID MICROSPHERES 1.1 MG/ML IV SUSP
1-20 mL | Freq: Once | INTRAVENOUS | 0 refills | Status: CP | PRN
Start: 2018-06-06 — End: ?
  Administered 2018-06-06: 20:00:00 2 mL via INTRAVENOUS

## 2018-06-06 MED ORDER — CARVEDILOL 12.5 MG PO TAB
12.5 mg | Freq: Once | ORAL | 0 refills | Status: CP
Start: 2018-06-06 — End: ?
  Administered 2018-06-06: 17:00:00 12.5 mg via ORAL

## 2018-06-06 MED ORDER — HYDRALAZINE 10 MG PO TAB
10 mg | Freq: Three times a day (TID) | ORAL | 0 refills | Status: DC
Start: 2018-06-06 — End: 2018-06-07
  Administered 2018-06-07 (×3): 10 mg via ORAL

## 2018-06-06 MED ORDER — HYDRALAZINE 10 MG PO TAB
10 mg | Freq: Once | ORAL | 0 refills | Status: CP
Start: 2018-06-06 — End: ?
  Administered 2018-06-06: 20:00:00 10 mg via ORAL

## 2018-06-06 MED ORDER — IMS MIXTURE TEMPLATE
30 meq | Freq: Once | ORAL | 0 refills | Status: CP
Start: 2018-06-06 — End: ?
  Administered 2018-06-06 (×2): 30 meq via ORAL

## 2018-06-06 MED ORDER — GABAPENTIN 100 MG PO CAP
100 mg | Freq: Every evening | ORAL | 0 refills | Status: DC | PRN
Start: 2018-06-06 — End: 2018-06-07

## 2018-06-06 MED ORDER — HYDRALAZINE 20 MG/ML IJ SOLN
10 mg | INTRAVENOUS | 0 refills | Status: DC | PRN
Start: 2018-06-06 — End: 2018-06-06
  Administered 2018-06-06: 14:00:00 10 mg via INTRAVENOUS

## 2018-06-06 MED ORDER — CARVEDILOL 12.5 MG PO TAB
25 mg | Freq: Two times a day (BID) | ORAL | 0 refills | Status: DC
Start: 2018-06-06 — End: 2018-06-11
  Administered 2018-06-07 – 2018-06-11 (×10): 25 mg via ORAL

## 2018-06-06 MED ORDER — GABAPENTIN 100 MG PO CAP
100 mg | Freq: Every evening | ORAL | 0 refills | Status: DC | PRN
Start: 2018-06-06 — End: 2018-06-09
  Administered 2018-06-07 – 2018-06-09 (×2): 100 mg via ORAL

## 2018-06-06 MED ORDER — NITROGLYCERIN IN 5 % DEXTROSE 50 MG/250 ML (200 MCG/ML) IV SOLN
0.1-3 ug/kg/min | INTRAVENOUS | 0 refills | Status: DC
Start: 2018-06-06 — End: 2018-06-07
  Administered 2018-06-06: 17:00:00 0.3 ug/kg/min via INTRAVENOUS

## 2018-06-07 ENCOUNTER — Encounter: Admit: 2018-06-07 | Discharge: 2018-06-07 | Payer: MEDICARE | Primary: Family

## 2018-06-07 DIAGNOSIS — G35 Multiple sclerosis: ICD-10-CM

## 2018-06-07 DIAGNOSIS — R918 Other nonspecific abnormal finding of lung field: Principal | ICD-10-CM

## 2018-06-07 DIAGNOSIS — K5792 Diverticulitis of intestine, part unspecified, without perforation or abscess without bleeding: ICD-10-CM

## 2018-06-07 DIAGNOSIS — I701 Atherosclerosis of renal artery: ICD-10-CM

## 2018-06-07 DIAGNOSIS — F319 Bipolar disorder, unspecified: ICD-10-CM

## 2018-06-07 DIAGNOSIS — I10 Essential (primary) hypertension: Secondary | ICD-10-CM

## 2018-06-07 DIAGNOSIS — E785 Hyperlipidemia, unspecified: ICD-10-CM

## 2018-06-07 DIAGNOSIS — F329 Major depressive disorder, single episode, unspecified: ICD-10-CM

## 2018-06-07 DIAGNOSIS — N390 Urinary tract infection, site not specified: ICD-10-CM

## 2018-06-07 LAB — COMPREHENSIVE METABOLIC PANEL: Lab: 143 MMOL/L — ABNORMAL LOW (ref >60)

## 2018-06-07 LAB — URINALYSIS MICROSCOPIC REFLEX TO CULTURE

## 2018-06-07 LAB — URINALYSIS DIPSTICK REFLEX TO CULTURE
Lab: NEGATIVE
Lab: NEGATIVE

## 2018-06-07 LAB — HEMOGLOBIN A1C: Lab: 5.5 % (ref 4.0–6.0)

## 2018-06-07 LAB — PTT (APTT)
Lab: 58 s — ABNORMAL HIGH (ref 24.0–36.5)
Lab: 66 s — ABNORMAL HIGH (ref 24.0–36.5)

## 2018-06-07 LAB — RENIN-RANDOM: Lab: 62 pg/mL — ABNORMAL HIGH (ref 3–45)

## 2018-06-07 LAB — PROTEIN/CR RATIO,UR RAN
Lab: 26 mg/dL
Lab: 58 mg/dL

## 2018-06-07 LAB — PLAVIX RESISTANCE (PLATELETWORKS): Lab: 21 % — ABNORMAL HIGH (ref 0–15)

## 2018-06-07 LAB — 25-OH VITAMIN D (D2 + D3): Lab: 52 ng/mL — ABNORMAL HIGH (ref 30–80)

## 2018-06-07 LAB — ALDOSTERONE-RANDOM: Lab: 27 ng/dL — ABNORMAL HIGH (ref 3–24)

## 2018-06-07 LAB — MAGNESIUM: Lab: 2.2 mg/dL — ABNORMAL LOW (ref >60)

## 2018-06-07 MED ORDER — HYDRALAZINE 10 MG PO TAB
10 mg | ORAL | 0 refills | Status: DC
Start: 2018-06-07 — End: 2018-06-07

## 2018-06-07 MED ORDER — HYDRALAZINE 10 MG PO TAB
10 mg | Freq: Once | ORAL | 0 refills | Status: DC
Start: 2018-06-07 — End: 2018-06-07

## 2018-06-07 MED ORDER — HYDRALAZINE 20 MG/ML IJ SOLN
10 mg | Freq: Once | INTRAVENOUS | 0 refills | Status: CP
Start: 2018-06-07 — End: ?

## 2018-06-07 MED ORDER — HYDRALAZINE 25 MG PO TAB
25 mg | Freq: Three times a day (TID) | ORAL | 0 refills | Status: DC
Start: 2018-06-07 — End: 2018-06-08
  Administered 2018-06-08: 01:00:00 25 mg via ORAL

## 2018-06-07 MED ADMIN — HYDRALAZINE 20 MG/ML IJ SOLN [3697]: 10 mg | INTRAVENOUS | Stop: 2018-06-07 | NDC 63323061400

## 2018-06-08 ENCOUNTER — Encounter: Admit: 2018-06-08 | Discharge: 2018-06-08 | Payer: MEDICARE | Primary: Family

## 2018-06-08 ENCOUNTER — Ambulatory Visit: Admit: 2018-06-08 | Discharge: 2018-06-08 | Payer: MEDICARE | Primary: Family

## 2018-06-08 LAB — PTT (APTT): Lab: 49 s — ABNORMAL HIGH (ref 24.0–36.5)

## 2018-06-08 LAB — COMPREHENSIVE METABOLIC PANEL
Lab: 142 MMOL/L — ABNORMAL LOW (ref >60)
Lab: 4 MMOL/L — ABNORMAL HIGH (ref 3.5–5.1)

## 2018-06-08 LAB — MAGNESIUM: Lab: 2.2 mg/dL — ABNORMAL LOW (ref 1.6–2.6)

## 2018-06-08 LAB — CBC AND DIFF: Lab: 6.9 K/UL — ABNORMAL HIGH (ref 4.5–11.0)

## 2018-06-08 MED ORDER — HYDRALAZINE 25 MG PO TAB
25 mg | Freq: Once | ORAL | 0 refills | Status: CP
Start: 2018-06-08 — End: ?
  Administered 2018-06-08: 09:00:00 25 mg via ORAL

## 2018-06-08 MED ORDER — HYDRALAZINE 50 MG PO TAB
50 mg | Freq: Three times a day (TID) | ORAL | 0 refills | Status: DC
Start: 2018-06-08 — End: 2018-06-09
  Administered 2018-06-08 – 2018-06-09 (×4): 50 mg via ORAL

## 2018-06-09 LAB — CBC AND DIFF: Lab: 6.8 K/UL — ABNORMAL LOW (ref 4.5–11.0)

## 2018-06-09 LAB — CULTURE-URINE W/SENSITIVITY
Lab: >10
Lab: >10 — AB

## 2018-06-09 LAB — VITAMIN B12: Lab: 469 pg/mL — ABNORMAL HIGH (ref 180–914)

## 2018-06-09 LAB — COMPREHENSIVE METABOLIC PANEL: Lab: 140 MMOL/L — ABNORMAL LOW (ref >60)

## 2018-06-09 LAB — MAGNESIUM: Lab: 2.3 mg/dL — ABNORMAL LOW (ref >60)

## 2018-06-09 LAB — PTT (APTT): Lab: 66 s — ABNORMAL HIGH (ref 24.0–36.5)

## 2018-06-09 MED ORDER — PANTOPRAZOLE 40 MG PO TBEC
40 mg | Freq: Every day | ORAL | 0 refills | Status: DC
Start: 2018-06-09 — End: 2018-06-12
  Administered 2018-06-09 – 2018-06-11 (×3): 40 mg via ORAL

## 2018-06-09 MED ORDER — HYDRALAZINE 25 MG PO TAB
25 mg | Freq: Once | ORAL | 0 refills | Status: AC
Start: 2018-06-09 — End: ?

## 2018-06-09 MED ORDER — IMS MIXTURE TEMPLATE
75 mg | Freq: Three times a day (TID) | ORAL | 0 refills | Status: DC
Start: 2018-06-09 — End: 2018-06-10
  Administered 2018-06-09 – 2018-06-10 (×5): 75 mg via ORAL

## 2018-06-10 LAB — CBC AND DIFF: Lab: 7.1 K/UL — ABNORMAL HIGH (ref >60)

## 2018-06-10 LAB — PROTIME INR (PT): Lab: 1 (ref 0.8–1.2)

## 2018-06-10 LAB — PLAVIX RESISTANCE (PLATELETWORKS): Lab: 23 % — ABNORMAL HIGH (ref 0–15)

## 2018-06-10 LAB — IRON + BINDING CAPACITY + %SAT+ FERRITIN
Lab: 17 % — ABNORMAL LOW (ref 28–42)
Lab: 386 ug/dL — ABNORMAL HIGH (ref 270–380)
Lab: 59 ng/mL (ref 10–200)
Lab: 67 ug/dL (ref 50–160)

## 2018-06-10 LAB — PTT (APTT): Lab: 56 s — ABNORMAL HIGH (ref 24.0–36.5)

## 2018-06-10 LAB — MAGNESIUM: Lab: 2.5 mg/dL — ABNORMAL HIGH (ref 1.6–2.6)

## 2018-06-10 MED ORDER — ERTAPENEM SYR
500 mg | INTRAVENOUS | 0 refills | Status: AC
Start: 2018-06-10 — End: ?
  Administered 2018-06-10 – 2018-06-16 (×6): 500 mg via INTRAVENOUS

## 2018-06-10 MED ORDER — HYDRALAZINE 100 MG PO TAB
100 mg | Freq: Three times a day (TID) | ORAL | 0 refills | Status: DC
Start: 2018-06-10 — End: 2018-06-11
  Administered 2018-06-10 – 2018-06-11 (×2): 100 mg via ORAL

## 2018-06-11 ENCOUNTER — Encounter: Admit: 2018-06-11 | Discharge: 2018-06-11 | Payer: MEDICARE | Primary: Family

## 2018-06-11 DIAGNOSIS — I161 Hypertensive emergency: Secondary | ICD-10-CM

## 2018-06-11 LAB — PTT (APTT)
Lab: >20 s — ABNORMAL HIGH (ref 24.0–36.5)
Lab: 191 s — ABNORMAL HIGH (ref 24.0–36.5)
Lab: 30 s — ABNORMAL HIGH (ref 24.0–36.5)

## 2018-06-11 LAB — BASIC METABOLIC PANEL
Lab: 10 pg (ref 3–12)
Lab: 121 mg/dL — ABNORMAL HIGH (ref 70–100)
Lab: 143 MMOL/L — ABNORMAL LOW (ref 137–147)
Lab: 2.2 mg/dL — ABNORMAL HIGH (ref 0.4–1.00)
Lab: 24 mL/min — ABNORMAL LOW (ref >60)
Lab: 29 mL/min — ABNORMAL LOW (ref >60)
Lab: 31 mg/dL — ABNORMAL HIGH (ref 7–25)
Lab: 8.1 mg/dL — ABNORMAL LOW (ref 8.5–10.6)

## 2018-06-11 LAB — POC IONIZED CALCIUM
Lab: 1.1 MMOL/L (ref 1.0–1.3)
Lab: 0.9 MMOL/L — ABNORMAL LOW (ref 1.0–1.3)
Lab: 0.9 MMOL/L — ABNORMAL LOW (ref 1.0–1.3)
Lab: 0.9 MMOL/L — ABNORMAL LOW (ref 1.0–1.3)
Lab: 0.9 MMOL/L — ABNORMAL LOW (ref 1.0–1.3)
Lab: 1.1 MMOL/L (ref 1.0–1.3)
Lab: 1 MMOL/L (ref 1.0–1.3)

## 2018-06-11 LAB — POC BLOOD GAS ARTERIAL
Lab: 100 % — ABNORMAL HIGH (ref 95–99)
Lab: 27 MMOL/L (ref 21–28)
Lab: 3 MMOL/L
Lab: 329 mmHg — ABNORMAL HIGH (ref 80–100)
Lab: 44 mmHg (ref 35–45)
Lab: 7.4 (ref 7.35–7.45)
Lab: 100 % — ABNORMAL HIGH (ref 95–99)
Lab: 43 mmHg (ref 35–45)
Lab: 561 mmHg — ABNORMAL HIGH (ref 80–100)
Lab: 7.4 mL/min — ABNORMAL HIGH (ref >60)
Lab: 100 % — ABNORMAL HIGH (ref 95–99)
Lab: 100 % — ABNORMAL HIGH (ref 95–99)
Lab: 28 MMOL/L (ref 21–28)
Lab: 3 MMOL/L
Lab: 30 MMOL/L — ABNORMAL HIGH (ref 21–28)
Lab: 42 mmHg (ref 35–45)
Lab: 46 mmHg — ABNORMAL HIGH (ref 35–45)
Lab: 510 mmHg — ABNORMAL HIGH (ref 80–100)
Lab: 575 mmHg — ABNORMAL HIGH (ref 80–100)
Lab: 6 MMOL/L
Lab: 7.3 (ref 7.35–7.45)
Lab: 7.4 — ABNORMAL HIGH (ref 7.35–7.45)
Lab: 100 % — ABNORMAL HIGH (ref 95–99)
Lab: 24 MMOL/L (ref 21–28)
Lab: 27 MMOL/L (ref 21–28)
Lab: 3 MMOL/L — ABNORMAL LOW (ref 24–44)
Lab: 44 mmHg (ref 35–45)
Lab: 46 mmHg — ABNORMAL HIGH (ref 35–45)
Lab: 7.3 — ABNORMAL LOW (ref 7.35–7.45)
Lab: 7.4 K/UL — ABNORMAL HIGH (ref 7.35–7.45)
Lab: 100 % — ABNORMAL HIGH (ref 95–99)
Lab: 26 MMOL/L (ref 21–28)
Lab: 48 mmHg — ABNORMAL HIGH (ref >60)
Lab: 7.3 mL/min — ABNORMAL LOW (ref >60)
Lab: 26 MMOL/L (ref 21–28)
Lab: 426 mmHg — ABNORMAL HIGH (ref 80–100)
Lab: 51 mmHg — ABNORMAL HIGH (ref 35–45)
Lab: 7.3 — ABNORMAL LOW (ref 7.35–7.45)
Lab: 38 mmHg (ref 35–45)
Lab: 7.3 — ABNORMAL LOW (ref 7.35–7.45)

## 2018-06-11 LAB — FIBRINOGEN: Lab: 167 mg/dL — ABNORMAL LOW (ref 200–400)

## 2018-06-11 LAB — POC GLUCOSE
Lab: 114 mg/dL — ABNORMAL HIGH (ref 70–100)
Lab: 120 mg/dL — ABNORMAL HIGH (ref 70–100)
Lab: 127 mg/dL — ABNORMAL HIGH (ref 70–100)
Lab: 128 mg/dL — ABNORMAL HIGH (ref 70–100)
Lab: 136 mg/dL — ABNORMAL HIGH (ref 70–100)
Lab: 144 mg/dL — ABNORMAL HIGH (ref 70–100)
Lab: 145 mg/dL — ABNORMAL HIGH (ref 70–100)
Lab: 153 mg/dL — ABNORMAL HIGH (ref 70–100)
Lab: 190 mg/dL — ABNORMAL HIGH (ref 70–100)
Lab: 199 mg/dL — ABNORMAL HIGH (ref 70–100)
Lab: 207 mg/dL — ABNORMAL HIGH (ref 70–100)
Lab: 235 mg/dL — ABNORMAL HIGH (ref 70–100)
Lab: 96 mg/dL (ref 70–100)

## 2018-06-11 LAB — ACTIVATED CLOTTING TIME HMS
Lab: >60 s
Lab: >60 s
Lab: >60 s
Lab: 147 s (ref 21–30)
Lab: >60 s

## 2018-06-11 LAB — POC SODIUM
Lab: 138 MMOL/L (ref 137–147)
Lab: 136 MMOL/L — ABNORMAL LOW (ref 137–147)
Lab: 136 MMOL/L — ABNORMAL LOW (ref 137–147)
Lab: 137 MMOL/L — ABNORMAL HIGH (ref 137–147)
Lab: 138 MMOL/L (ref 137–147)
Lab: 140 MMOL/L (ref 137–147)
Lab: 141 MMOL/L (ref 137–147)

## 2018-06-11 LAB — CBC
Lab: 27 % — ABNORMAL LOW (ref 36–45)
Lab: 7.4 10*3/uL (ref 4.5–11.0)

## 2018-06-11 LAB — CBC AND DIFF
Lab: 3.6 M/UL — ABNORMAL LOW (ref >60)
Lab: 7.6 K/UL — ABNORMAL LOW (ref 4.5–11.0)

## 2018-06-11 LAB — MAGNESIUM
Lab: 2.4 mg/dL — ABNORMAL LOW (ref 1.6–2.6)
Lab: 3.5 mg/dL — ABNORMAL HIGH (ref 1.6–2.6)

## 2018-06-11 LAB — POC HEMATOCRIT
Lab: 10 g/dL — ABNORMAL LOW (ref 12.0–15.0)
Lab: 30 % — ABNORMAL LOW (ref 36–45)
Lab: 5.8 g/dL — CL (ref 12.0–15.0)
Lab: 20 % — ABNORMAL LOW (ref 36–45)
Lab: 23 % — ABNORMAL LOW (ref 36–45)
Lab: 6.8 g/dL — ABNORMAL LOW (ref 12.0–15.0)
Lab: 7.8 g/dL — ABNORMAL LOW (ref 12.0–15.0)
Lab: 20 % — ABNORMAL LOW (ref 36–45)
Lab: 6.8 g/dL — ABNORMAL LOW (ref 12.0–15.0)
Lab: 7.8 g/dL — ABNORMAL LOW (ref 12.0–15.0)
Lab: 21 % — ABNORMAL LOW (ref 36–45)
Lab: 7.1 g/dL — ABNORMAL LOW (ref 12.0–15.0)
Lab: 23 % — ABNORMAL LOW (ref 36–45)
Lab: 7.8 g/dL — ABNORMAL LOW (ref 12.0–15.0)

## 2018-06-11 LAB — COMPREHENSIVE METABOLIC PANEL: Lab: 142 MMOL/L — ABNORMAL LOW (ref >60)

## 2018-06-11 LAB — POC POTASSIUM
Lab: 4.7 MMOL/L (ref 3.5–5.1)
Lab: 6.5 MMOL/L — ABNORMAL HIGH (ref 3.5–5.1)
Lab: 5.9 MMOL/L — ABNORMAL HIGH (ref 3.5–5.1)
Lab: 6.6 MMOL/L — ABNORMAL HIGH (ref 3.5–5.1)
Lab: 4.8 MMOL/L — ABNORMAL HIGH (ref 3.5–5.1)
Lab: 5.8 MMOL/L — ABNORMAL HIGH (ref 3.5–5.1)
Lab: 4.7 MMOL/L (ref 3.5–5.1)
Lab: 4.8 MMOL/L (ref 3.5–5.1)

## 2018-06-11 LAB — PROTIME INR (PT)
Lab: 1.2 mmHg — ABNORMAL HIGH (ref 0.8–1.2)
Lab: 1.3 MMOL/L — ABNORMAL HIGH (ref 0.8–1.2)

## 2018-06-11 LAB — BETA-HCG: Lab: 5 U/L — ABNORMAL HIGH (ref <5)

## 2018-06-11 LAB — BASELINE ACTIVATED CLOTTING TIME HMS: Lab: 175 s

## 2018-06-11 MED ORDER — HEPARIN 20000UNITS PLASMALYTE-A 1000ML IRR(OR)
0 refills | Status: DC
Start: 2018-06-11 — End: 2018-06-11
  Administered 2018-06-11 (×2): 500 mL

## 2018-06-11 MED ORDER — DESMOPRESSIN IVPB
.3 ug/kg | Freq: Once | INTRAVENOUS | 0 refills | Status: CP
Start: 2018-06-11 — End: ?
  Administered 2018-06-11 (×2): 17.49 ug via INTRAVENOUS

## 2018-06-11 MED ORDER — AMINOCAPROIC ACID 12.5 GM INFUSION (OR)
INTRAVENOUS | 0 refills | Status: DC
Start: 2018-06-11 — End: 2018-06-11
  Administered 2018-06-11 (×2): 40 mL/h via INTRAVENOUS

## 2018-06-11 MED ORDER — SODIUM CHLORIDE 0.9 % IV SOLP
0 refills | Status: DC
Start: 2018-06-11 — End: 2018-06-11
  Administered 2018-06-11: 12:00:00 via INTRAVENOUS

## 2018-06-11 MED ORDER — ACETAMINOPHEN 325 MG PO TAB
650 mg | ORAL | 0 refills | Status: DC | PRN
Start: 2018-06-11 — End: 2018-06-21
  Administered 2018-06-17 – 2018-06-21 (×7): 650 mg via ORAL

## 2018-06-11 MED ORDER — MIDAZOLAM 1 MG/ML IJ SOLN
INTRAVENOUS | 0 refills | Status: DC
Start: 2018-06-11 — End: 2018-06-11
  Administered 2018-06-11: 13:00:00 2 mg via INTRAVENOUS
  Administered 2018-06-11: 16:00:00 1 mg via INTRAVENOUS

## 2018-06-11 MED ORDER — ACETAMINOPHEN 650 MG RE SUPP
650 mg | RECTAL | 0 refills | Status: DC | PRN
Start: 2018-06-11 — End: 2018-06-21

## 2018-06-11 MED ORDER — THROMBIN (BOVINE) 5,000 UNIT TP SOLR
0 refills | Status: DC
Start: 2018-06-11 — End: 2018-06-11
  Administered 2018-06-11: 16:00:00 5000 [IU] via TOPICAL

## 2018-06-11 MED ORDER — INSULIN 100UNITS NS 100ML
INTRAVENOUS | 0 refills | Status: DC
Start: 2018-06-11 — End: 2018-06-11
  Administered 2018-06-11 (×2): 2 [IU] via INTRAVENOUS
  Administered 2018-06-11 (×2): 2 [IU]/h via INTRAVENOUS

## 2018-06-11 MED ORDER — POTASSIUM CHLORIDE IN WATER 10 MEQ/50 ML IV PGBK
10 meq | INTRAVENOUS | 0 refills | Status: DC | PRN
Start: 2018-06-11 — End: 2018-06-21

## 2018-06-11 MED ORDER — ELECTROLYTE-A IV SOLP
0 refills | Status: DC
Start: 2018-06-11 — End: 2018-06-11
  Administered 2018-06-11: 13:00:00 via INTRAVENOUS

## 2018-06-11 MED ORDER — NOREPINEPHRINE BITARTRATE-NACL 4 MG/250 ML (16 MCG/ML) IV SOLN
.01-.4 ug/kg/min | INTRAVENOUS | 0 refills | Status: DC
Start: 2018-06-11 — End: 2018-06-13

## 2018-06-11 MED ORDER — VANCOMYCIN 1,000 MG IV SOLR
0 refills | Status: DC
Start: 2018-06-11 — End: 2018-06-11
  Administered 2018-06-11: 15:00:00 1 g via TOPICAL

## 2018-06-11 MED ORDER — PHENYLEPHRINE IN 0.9% NACL(PF) 1 MG/10 ML (100 MCG/ML) IV SYRG
0 refills | Status: DC
Start: 2018-06-11 — End: 2018-06-11
  Administered 2018-06-11: 16:00:00 50 ug via INTRAVENOUS
  Administered 2018-06-11: 15:00:00 100 ug via INTRAVENOUS
  Administered 2018-06-11: 16:00:00 50 ug via INTRAVENOUS
  Administered 2018-06-11 (×2): 100 ug via INTRAVENOUS

## 2018-06-11 MED ORDER — SODIUM CHLORIDE 0.9 % IV SOLP
0 refills | Status: CP
Start: 2018-06-11 — End: ?
  Administered 2018-06-11: 15:00:00 4000 mL

## 2018-06-11 MED ORDER — PROPOFOL INJ 10 MG/ML IV VIAL
0 refills | Status: DC
Start: 2018-06-11 — End: 2018-06-11
  Administered 2018-06-11: 13:00:00 50 mg via INTRAVENOUS

## 2018-06-11 MED ORDER — ALBUTEROL SULFATE 2.5 MG/0.5 ML IN NEBU
2.5 mg | RESPIRATORY_TRACT | 0 refills | Status: DC | PRN
Start: 2018-06-11 — End: 2018-06-12
  Administered 2018-06-11: 21:00:00 2.5 mg via RESPIRATORY_TRACT

## 2018-06-11 MED ORDER — INSULIN 100UNITS NS 100ML
1-32 [IU]/h | INTRAVENOUS | 0 refills | Status: DC
Start: 2018-06-11 — End: 2018-06-12

## 2018-06-11 MED ORDER — DEXMEDETOMIDINE IV DRIP (STD CONC)
0.2-1 ug/kg/h | INTRAVENOUS | 0 refills | Status: DC
Start: 2018-06-11 — End: 2018-06-12

## 2018-06-11 MED ORDER — ALBUMIN, HUMAN 5 % IV SOLP
250 mL | Freq: Once | INTRAVENOUS | 0 refills | Status: CP
Start: 2018-06-11 — End: ?
  Administered 2018-06-11: 21:00:00 250 mL via INTRAVENOUS

## 2018-06-11 MED ORDER — DEXMEDETOMIDINE 4 MCG/ML (INFUSION)(AM)(OR)
0 refills | Status: DC
Start: 2018-06-11 — End: 2018-06-11
  Administered 2018-06-11: 19:00:00 .7 ug/kg/h via INTRAVENOUS

## 2018-06-11 MED ORDER — NITROGLYCERIN IN 5 % DEXTROSE 50 MG/250 ML (200 MCG/ML) IV SOLN
.1-3 ug/kg/min | INTRAVENOUS | 0 refills | Status: DC
Start: 2018-06-11 — End: 2018-06-13

## 2018-06-11 MED ORDER — NOREPINEPHRINE IV DRIP STD CONC (AM)(OR)
0 refills | Status: DC
Start: 2018-06-11 — End: 2018-06-11
  Administered 2018-06-11: 15:00:00 0.01 ug/kg/min via INTRAVENOUS

## 2018-06-11 MED ORDER — OXYCODONE 5 MG PO TAB
5-10 mg | ORAL | 0 refills | Status: DC | PRN
Start: 2018-06-11 — End: 2018-06-16
  Administered 2018-06-12 (×2): 10 mg via ORAL
  Administered 2018-06-12: 01:00:00 5 mg via ORAL
  Administered 2018-06-13 (×3): 10 mg via ORAL
  Administered 2018-06-15 – 2018-06-16 (×3): 5 mg via ORAL
  Administered 2018-06-16: 08:00:00 10 mg via ORAL

## 2018-06-11 MED ORDER — AUTOLOGOUS BLOOD (CELL SAVER)
0 refills | Status: DC
Start: 2018-06-11 — End: 2018-06-11

## 2018-06-11 MED ORDER — POTASSIUM CHLORIDE 20 MEQ/15 ML PO LIQD
20-40 meq | NASOGASTRIC | 0 refills | Status: DC | PRN
Start: 2018-06-11 — End: 2018-06-21

## 2018-06-11 MED ORDER — ONDANSETRON HCL 4 MG PO TAB
4 mg | ORAL | 0 refills | Status: DC | PRN
Start: 2018-06-11 — End: 2018-06-21

## 2018-06-11 MED ORDER — BISACODYL 10 MG RE SUPP
10 mg | Freq: Every day | RECTAL | 0 refills | Status: DC | PRN
Start: 2018-06-11 — End: 2018-06-15

## 2018-06-11 MED ORDER — ACETAMINOPHEN 500 MG PO TAB
1000 mg | ORAL | 0 refills | Status: AC
Start: 2018-06-11 — End: ?
  Administered 2018-06-12 – 2018-06-15 (×11): 1000 mg via ORAL

## 2018-06-11 MED ORDER — FAMOTIDINE 20 MG PO TAB
20 mg | Freq: Two times a day (BID) | ORAL | 0 refills | Status: DC
Start: 2018-06-11 — End: 2018-06-11

## 2018-06-11 MED ORDER — ALBUMIN, HUMAN 5 % IV SOLP
250 mL | Freq: Once | INTRAVENOUS | 0 refills | Status: CP
Start: 2018-06-11 — End: ?
  Administered 2018-06-11: 250 mL via INTRAVENOUS

## 2018-06-11 MED ORDER — MIDAZOLAM 1 MG/ML IJ SOLN
1-5 mg | INTRAVENOUS | 0 refills | Status: DC | PRN
Start: 2018-06-11 — End: 2018-06-12

## 2018-06-11 MED ORDER — DOBUTAMINE IN D5W 1,000 MG/250 ML (4,000 MCG/ML) IV SOLP (INFUSION)(AM)(OR)
0 refills | Status: DC
Start: 2018-06-11 — End: 2018-06-11
  Administered 2018-06-11: 19:00:00 3 ug/kg/min via INTRAVENOUS

## 2018-06-11 MED ORDER — DOBUTAMINE IN D5W 1,000 MG/250 ML (4,000 MCG/ML) IV SOLP
1.5 ug/kg/min | INTRAVENOUS | 0 refills | Status: DC
Start: 2018-06-11 — End: 2018-06-13

## 2018-06-11 MED ORDER — PROTAMINE 10 MG/ML IV SOLN
0 refills | Status: DC
Start: 2018-06-11 — End: 2018-06-11
  Administered 2018-06-11 (×2): 20 mg via INTRAVENOUS
  Administered 2018-06-11 (×4): 30 mg via INTRAVENOUS

## 2018-06-11 MED ORDER — AMINOCAPROIC ACID 250 MG/ML IV SOLN
INTRAVENOUS | 0 refills | Status: DC
Start: 2018-06-11 — End: 2018-06-11
  Administered 2018-06-11: 15:00:00 5 g via INTRAVENOUS

## 2018-06-11 MED ORDER — FAMOTIDINE (PF) 20 MG/2 ML IV SOLN
20 mg | Freq: Two times a day (BID) | INTRAVENOUS | 0 refills | Status: DC
Start: 2018-06-11 — End: 2018-06-11

## 2018-06-11 MED ORDER — PAPAVERINE 60 MG SYRINGE (OR)
0 refills | Status: DC
Start: 2018-06-11 — End: 2018-06-11
  Administered 2018-06-11 (×2): 20 mL

## 2018-06-11 MED ORDER — DEXTRAN 70-HYPROMELLOSE (PF) 0.1-0.3 % OP DPET
0 refills | Status: DC
Start: 2018-06-11 — End: 2018-06-11
  Administered 2018-06-11: 13:00:00 2 [drp] via OPHTHALMIC

## 2018-06-11 MED ORDER — IPRATROPIUM BROMIDE 0.02 % IN SOLN
0.5 mg | RESPIRATORY_TRACT | 0 refills | Status: DC | PRN
Start: 2018-06-11 — End: 2018-06-12
  Administered 2018-06-11: 21:00:00 0.5 mg via RESPIRATORY_TRACT

## 2018-06-11 MED ORDER — ONDANSETRON HCL (PF) 4 MG/2 ML IJ SOLN
4 mg | INTRAVENOUS | 0 refills | Status: DC | PRN
Start: 2018-06-11 — End: 2018-06-21
  Administered 2018-06-14 – 2018-06-16 (×4): 4 mg via INTRAVENOUS

## 2018-06-11 MED ORDER — AMIODARONE 200 MG PO TAB
400 mg | Freq: Two times a day (BID) | ORAL | 0 refills | Status: DC
Start: 2018-06-11 — End: 2018-06-21
  Administered 2018-06-12 – 2018-06-21 (×20): 400 mg via ORAL

## 2018-06-11 MED ORDER — IV PB BUILDER
Freq: Once | INTRAVENOUS | 0 refills | Status: CP
Start: 2018-06-11 — End: ?
  Administered 2018-06-11 (×2): 39.1 mL/h via INTRAVENOUS

## 2018-06-11 MED ORDER — MAGNESIUM SULFATE IN WATER 4 GRAM/50 ML (8 %) IV PGBK
4 g | Freq: Once | INTRAVENOUS | 0 refills | Status: DC
Start: 2018-06-11 — End: 2018-06-11

## 2018-06-11 MED ORDER — MAGNESIUM HYDROXIDE 2,400 MG/10 ML PO SUSP
10 mL | Freq: Every day | ORAL | 0 refills | Status: DC | PRN
Start: 2018-06-11 — End: 2018-06-21

## 2018-06-11 MED ORDER — ATROPINE 0.1 MG/ML IJ SYRG
.5 mg | Freq: Once | INTRAVENOUS | 0 refills | Status: AC | PRN
Start: 2018-06-11 — End: ?

## 2018-06-11 MED ORDER — FENTANYL CITRATE (PF) 50 MCG/ML IJ SOLN
25-50 ug | INTRAVENOUS | 0 refills | Status: DC | PRN
Start: 2018-06-11 — End: 2018-06-14
  Administered 2018-06-11 – 2018-06-12 (×3): 50 ug via INTRAVENOUS
  Administered 2018-06-12: 12:00:00 100 ug via INTRAVENOUS
  Administered 2018-06-12 – 2018-06-13 (×5): 50 ug via INTRAVENOUS

## 2018-06-11 MED ORDER — VECURONIUM BROMIDE 10 MG IV SOLR
INTRAVENOUS | 0 refills | Status: DC
Start: 2018-06-11 — End: 2018-06-11
  Administered 2018-06-11: 13:00:00 8 mg via INTRAVENOUS
  Administered 2018-06-11: 16:00:00 2 mg via INTRAVENOUS

## 2018-06-11 MED ORDER — SENNOSIDES-DOCUSATE SODIUM 8.6-50 MG PO TAB
2 | Freq: Two times a day (BID) | ORAL | 0 refills | Status: DC
Start: 2018-06-11 — End: 2018-06-21
  Administered 2018-06-12 – 2018-06-21 (×15): 2 via ORAL

## 2018-06-11 MED ORDER — ASPIRIN 81 MG PO CHEW
81 mg | Freq: Every day | ORAL | 0 refills | Status: DC
Start: 2018-06-11 — End: 2018-06-21
  Administered 2018-06-12 – 2018-06-21 (×10): 81 mg via ORAL

## 2018-06-11 MED ORDER — ALUM-MAG HYDROXIDE-SIMETH 200-200-20 MG/5 ML PO SUSP
30 mL | ORAL | 0 refills | Status: DC | PRN
Start: 2018-06-11 — End: 2018-06-21

## 2018-06-11 MED ORDER — FENTANYL CITRATE (PF) 50 MCG/ML IJ SOLN
0 refills | Status: DC
Start: 2018-06-11 — End: 2018-06-11
  Administered 2018-06-11: 14:00:00 200 ug via INTRAVENOUS
  Administered 2018-06-11: 14:00:00 150 ug via INTRAVENOUS
  Administered 2018-06-11 (×2): 100 ug via INTRAVENOUS
  Administered 2018-06-11: 13:00:00 250 ug via INTRAVENOUS

## 2018-06-11 MED ORDER — ERTAPENEM 1GM IVP
0 refills | Status: DC
Start: 2018-06-11 — End: 2018-06-11
  Administered 2018-06-11: 14:00:00 1 g via INTRAVENOUS

## 2018-06-11 MED ORDER — ACETYLCYSTEINE 200 MG/ML (20 %) IV SOLN
100 mg/kg | Freq: Once | INTRAVENOUS | 0 refills | Status: AC
Start: 2018-06-11 — End: ?

## 2018-06-11 MED ORDER — LIDOCAINE (PF) 100 MG/5 ML (2 %) IV SYRG
1 mg/kg | INTRAVENOUS | 0 refills | Status: DC | PRN
Start: 2018-06-11 — End: 2018-06-21

## 2018-06-11 MED ORDER — DOBUTAMINE IN D5W 1,000 MG/250 ML (4,000 MCG/ML) IV SOLP (INFUSION)(AM)(OR)
0 refills | Status: DC
Start: 2018-06-11 — End: 2018-06-11

## 2018-06-11 MED ORDER — LIDOCAINE (PF) 200 MG/10 ML (2 %) IJ SYRG
0 refills | Status: DC
Start: 2018-06-11 — End: 2018-06-11
  Administered 2018-06-11: 18:00:00 100 mg via INTRAVENOUS

## 2018-06-11 MED ORDER — SODIUM CHLORIDE 0.9 % IV SOLP
INTRAVENOUS | 0 refills | Status: DC
Start: 2018-06-11 — End: 2018-06-13
  Administered 2018-06-12: 20:00:00 1000.000 mL via INTRAVENOUS

## 2018-06-11 MED ORDER — NITROGLYCERIN IN 5 % DEXTROSE 50 MG/250 ML (200 MCG/ML) IV SOLN (INFUSION)(AM)(OR)
INTRAVENOUS | 0 refills | Status: DC
Start: 2018-06-11 — End: 2018-06-11
  Administered 2018-06-11: 18:00:00 .5 ug/kg/min via INTRAVENOUS
  Administered 2018-06-11: 18:00:00 20 ug/kg/min via INTRAVENOUS
  Administered 2018-06-11: 18:00:00 20 ug via INTRAVENOUS

## 2018-06-11 MED ORDER — HEPARIN (PORCINE) 1,000 UNIT/ML IJ SOLN
INTRAVENOUS | 0 refills | Status: DC
Start: 2018-06-11 — End: 2018-06-11
  Administered 2018-06-11: 15:00:00 20000 [IU] via INTRAVENOUS
  Administered 2018-06-11: 14:00:00 5000 [IU] via INTRAVENOUS

## 2018-06-11 MED ORDER — HEPARIN (PORCINE) IN 5 % DEX 20,000 UNIT/500 ML (40 UNIT/ML) IV SOLP (INFUSION)(AM)(OR)
0 refills | Status: DC
Start: 2018-06-11 — End: 2018-06-11
  Administered 2018-06-11: 15:00:00 500 [IU]/h via INTRAVENOUS

## 2018-06-11 MED ORDER — HYDRALAZINE 20 MG/ML IJ SOLN
10 mg | INTRAVENOUS | 0 refills | Status: DC | PRN
Start: 2018-06-11 — End: 2018-06-21
  Administered 2018-06-15 – 2018-06-17 (×4): 10 mg via INTRAVENOUS

## 2018-06-11 MED ORDER — AMINOCAPROIC ACID INFUSION
2 g/h | INTRAVENOUS | 0 refills | Status: AC
Start: 2018-06-11 — End: ?

## 2018-06-11 MED ORDER — POTASSIUM CHLORIDE 20 MEQ PO TBTQ
20-40 meq | ORAL | 0 refills | Status: DC | PRN
Start: 2018-06-11 — End: 2018-06-21
  Administered 2018-06-14: 13:00:00 40 meq via ORAL
  Administered 2018-06-16: 11:00:00 20 meq via ORAL
  Administered 2018-06-17: 11:00:00 40 meq via ORAL
  Administered 2018-06-18 – 2018-06-20 (×2): 20 meq via ORAL

## 2018-06-12 LAB — CBC
Lab: 10 K/UL — ABNORMAL LOW (ref >60)
Lab: 118 10*3/uL — ABNORMAL LOW (ref 150–400)
Lab: 15 % — ABNORMAL HIGH (ref 11–15)
Lab: 2.9 M/UL — ABNORMAL LOW (ref 4.0–5.0)
Lab: 26 % — ABNORMAL LOW (ref 36–45)
Lab: 30 pg (ref 26–34)
Lab: 33 g/dL (ref 32.0–36.0)
Lab: 89 FL (ref 80–100)
Lab: 9 g/dL — ABNORMAL LOW (ref 12.0–15.0)
Lab: 9.2 FL (ref 7–11)
Lab: 9.5 10*3/uL (ref 4.5–11.0)
Lab: 11 10*3/uL — ABNORMAL HIGH (ref 4.5–11.0)
Lab: 128 K/UL — ABNORMAL LOW (ref >60)
Lab: 15 % — ABNORMAL HIGH (ref 11–15)
Lab: 2.9 M/UL — ABNORMAL LOW (ref 4.0–5.0)
Lab: 26 % — ABNORMAL LOW (ref 36–45)
Lab: 29 pg — ABNORMAL HIGH (ref 26–34)
Lab: 33 g/dL — ABNORMAL HIGH (ref 32.0–36.0)
Lab: 8.7 g/dL — ABNORMAL LOW (ref 12.0–15.0)
Lab: 88 FL — ABNORMAL HIGH (ref 80–100)

## 2018-06-12 LAB — POC BLOOD GAS ARTERIAL
Lab: 21 MMOL/L (ref 21–28)
Lab: 47 mmHg — ABNORMAL HIGH (ref 35–45)
Lab: 6 MMOL/L
Lab: 7.2 % — ABNORMAL LOW (ref 7.35–7.45)
Lab: 99 % (ref 95–99)
Lab: 170 mmHg — ABNORMAL HIGH (ref 80–100)
Lab: 22 MMOL/L (ref 21–28)
Lab: 4 MMOL/L
Lab: 45 mmHg (ref 35–45)
Lab: 99 % (ref 95–99)
Lab: 7.3 — ABNORMAL LOW (ref 7.35–7.45)
Lab: 167 mmHg — ABNORMAL HIGH (ref 80–100)
Lab: 22 MMOL/L — ABNORMAL HIGH (ref 21–28)
Lab: 4 MMOL/L
Lab: 44 mmHg (ref 35–45)
Lab: 7.3 — ABNORMAL LOW (ref 7.35–7.45)
Lab: 99 % (ref 95–99)

## 2018-06-12 LAB — BASIC METABOLIC PANEL
Lab: 146 MMOL/L — ABNORMAL LOW (ref >60)
Lab: 113 MMOL/L — ABNORMAL HIGH (ref 98–110)
Lab: 118 mg/dL — ABNORMAL HIGH (ref 70–100)
Lab: 144 MMOL/L (ref 137–147)
Lab: 2.4 mg/dL — ABNORMAL HIGH (ref 0.4–1.00)
Lab: 20 MMOL/L — ABNORMAL LOW (ref 21–30)
Lab: 21 mL/min — ABNORMAL LOW (ref >60)
Lab: 25 mL/min — ABNORMAL LOW (ref >60)
Lab: 39 mg/dL — ABNORMAL HIGH (ref 7–25)
Lab: 5.6 MMOL/L — ABNORMAL HIGH (ref 3.5–5.1)
Lab: 8.8 mg/dL (ref 8.5–10.6)
Lab: 11 (ref 3–12)
Lab: 142 MMOL/L (ref 137–147)
Lab: 24 mL/min — ABNORMAL LOW (ref >60)

## 2018-06-12 LAB — POC GLUCOSE
Lab: 114 mg/dL — ABNORMAL HIGH (ref 70–100)
Lab: 115 mg/dL — ABNORMAL HIGH (ref 70–100)
Lab: 129 mg/dL — ABNORMAL HIGH (ref 70–100)
Lab: 116 mg/dL — ABNORMAL HIGH (ref 70–100)
Lab: 104 mg/dL — ABNORMAL HIGH (ref 70–100)

## 2018-06-12 LAB — O2 SATURATION, MIXED VENOUS
Lab: 31 %
Lab: 52 %
Lab: 67 %

## 2018-06-12 LAB — BLOOD GASES, ARTERIAL
Lab: 138 mmHg — ABNORMAL HIGH (ref 80–100)
Lab: 20 MMOL/L — ABNORMAL LOW (ref 21–28)
Lab: 44 mmHg (ref 35–45)
Lab: 5.1 MMOL/L
Lab: 98 % (ref 95–99)
Lab: 7.2 — ABNORMAL LOW (ref 7.35–7.45)

## 2018-06-12 LAB — MAGNESIUM: Lab: 3.1 mg/dL — ABNORMAL HIGH (ref 1.6–2.6)

## 2018-06-12 MED ORDER — INSULIN REGULAR HUMAN(#) 1 UNIT/ML IJ SYRINGE
10 [IU] | Freq: Once | INTRAVENOUS | 0 refills | Status: CP
Start: 2018-06-12 — End: ?
  Administered 2018-06-12: 14:00:00 10 [IU] via INTRAVENOUS

## 2018-06-12 MED ORDER — PERFLUTREN LIPID MICROSPHERES 1.1 MG/ML IV SUSP
1-20 mL | Freq: Once | INTRAVENOUS | 0 refills | Status: CP | PRN
Start: 2018-06-12 — End: ?
  Administered 2018-06-12: 15:00:00 3 mL via INTRAVENOUS

## 2018-06-12 MED ORDER — POLYETHYLENE GLYCOL 3350 17 GRAM PO PWPK
1 | Freq: Two times a day (BID) | ORAL | 0 refills | Status: DC
Start: 2018-06-12 — End: 2018-06-21
  Administered 2018-06-12 – 2018-06-19 (×10): 17 g via ORAL

## 2018-06-12 MED ORDER — ALBUMIN, HUMAN 5 % IV SOLP
250 mL | Freq: Once | INTRAVENOUS | 0 refills | Status: CP
Start: 2018-06-12 — End: ?
  Administered 2018-06-12: 08:00:00 250 mL via INTRAVENOUS

## 2018-06-12 MED ORDER — INSULIN ASPART 100 UNIT/ML SC FLEXPEN
0-12 [IU] | Freq: Before meals | SUBCUTANEOUS | 0 refills | Status: DC
Start: 2018-06-12 — End: 2018-06-21

## 2018-06-12 MED ORDER — DEXTROSE 50 % IN WATER (D50W) IV SOLP
25 g | Freq: Once | INTRAVENOUS | 0 refills | Status: CP
Start: 2018-06-12 — End: ?
  Administered 2018-06-12: 14:00:00 50 mL via INTRAVENOUS

## 2018-06-12 MED ORDER — ALBUTEROL SULFATE 90 MCG/ACTUATION IN HFAA
2 | RESPIRATORY_TRACT | 0 refills | Status: DC | PRN
Start: 2018-06-12 — End: 2018-06-21

## 2018-06-13 ENCOUNTER — Inpatient Hospital Stay: Admit: 2018-06-13 | Discharge: 2018-06-13 | Payer: MEDICARE | Primary: Family

## 2018-06-13 ENCOUNTER — Encounter: Admit: 2018-06-13 | Discharge: 2018-06-13 | Payer: MEDICARE | Primary: Family

## 2018-06-13 DIAGNOSIS — I161 Hypertensive emergency: Secondary | ICD-10-CM

## 2018-06-13 DIAGNOSIS — F319 Bipolar disorder, unspecified: ICD-10-CM

## 2018-06-13 DIAGNOSIS — I10 Essential (primary) hypertension: Secondary | ICD-10-CM

## 2018-06-13 DIAGNOSIS — F329 Major depressive disorder, single episode, unspecified: ICD-10-CM

## 2018-06-13 DIAGNOSIS — R918 Other nonspecific abnormal finding of lung field: Principal | ICD-10-CM

## 2018-06-13 DIAGNOSIS — N390 Urinary tract infection, site not specified: ICD-10-CM

## 2018-06-13 DIAGNOSIS — E785 Hyperlipidemia, unspecified: ICD-10-CM

## 2018-06-13 DIAGNOSIS — K5792 Diverticulitis of intestine, part unspecified, without perforation or abscess without bleeding: ICD-10-CM

## 2018-06-13 DIAGNOSIS — I701 Atherosclerosis of renal artery: ICD-10-CM

## 2018-06-13 DIAGNOSIS — G35 Multiple sclerosis: ICD-10-CM

## 2018-06-13 LAB — BASIC METABOLIC PANEL
Lab: 104 MMOL/L (ref 98–110)
Lab: 120 mg/dL — ABNORMAL HIGH (ref 70–100)
Lab: 134 MMOL/L — ABNORMAL LOW (ref 137–147)
Lab: 17 mL/min — ABNORMAL LOW (ref >60)
Lab: 20 MMOL/L — ABNORMAL LOW (ref 21–30)
Lab: 20 mL/min — ABNORMAL LOW (ref >60)
Lab: 3 mg/dL — ABNORMAL HIGH (ref 0.4–1.00)
Lab: 4.8 MMOL/L (ref 3.5–5.1)
Lab: 43 mg/dL — ABNORMAL HIGH (ref 7–25)
Lab: 8.5 mg/dL (ref 8.5–10.6)
Lab: 10 (ref 3–12)
Lab: 135 MMOL/L — ABNORMAL LOW (ref >60)
Lab: 4.7 MMOL/L — ABNORMAL LOW (ref 3.5–5.1)

## 2018-06-13 LAB — POC GLUCOSE
Lab: 119 mg/dL — ABNORMAL HIGH (ref 70–100)
Lab: 113 mg/dL — ABNORMAL HIGH (ref 70–100)
Lab: 98 mg/dL (ref 70–100)
Lab: 133 mg/dL — ABNORMAL HIGH (ref 70–100)
Lab: 109 mg/dL — ABNORMAL HIGH (ref 70–100)

## 2018-06-13 LAB — CBC: Lab: 10 K/UL — ABNORMAL LOW (ref <0.4)

## 2018-06-13 LAB — SODIUM-URINE RANDOM: Lab: 15 MMOL/L — ABNORMAL LOW (ref 150–400)

## 2018-06-13 LAB — CREATININE-URINE RANDOM: Lab: 83 mg/dL (ref 7–11)

## 2018-06-13 MED ORDER — FUROSEMIDE 10 MG/ML IJ SOLN
20 mg | Freq: Once | INTRAVENOUS | 0 refills | Status: CP
Start: 2018-06-13 — End: ?

## 2018-06-13 MED ORDER — FUROSEMIDE 10 MG/ML IJ SOLN
20 mg | Freq: Once | INTRAVENOUS | 0 refills | Status: CP
Start: 2018-06-13 — End: ?
  Administered 2018-06-13: 21:00:00 20 mg via INTRAVENOUS

## 2018-06-13 MED ORDER — CARVEDILOL 12.5 MG PO TAB
12.5 mg | Freq: Two times a day (BID) | ORAL | 0 refills | Status: DC
Start: 2018-06-13 — End: 2018-06-16
  Administered 2018-06-13 – 2018-06-16 (×7): 12.5 mg via ORAL

## 2018-06-13 MED ORDER — FENTANYL CITRATE (PF) 50 MCG/ML IJ SOLN
50 ug | Freq: Once | INTRAVENOUS | 0 refills | Status: CP
Start: 2018-06-13 — End: ?
  Administered 2018-06-13: 10:00:00 50 ug via INTRAVENOUS

## 2018-06-13 MED ADMIN — FUROSEMIDE 10 MG/ML IJ SOLN [3291]: 20 mg | INTRAVENOUS | @ 14:00:00 | Stop: 2018-06-13 | NDC 00409610218

## 2018-06-14 ENCOUNTER — Inpatient Hospital Stay: Admit: 2018-06-14 | Discharge: 2018-06-14 | Payer: MEDICARE | Primary: Family

## 2018-06-14 ENCOUNTER — Inpatient Hospital Stay: Admit: 2018-06-05 | Discharge: 2018-06-05 | Payer: MEDICARE | Primary: Family

## 2018-06-14 LAB — POC GLUCOSE
Lab: 117 mg/dL — ABNORMAL HIGH (ref 70–100)
Lab: 100 mg/dL (ref 70–100)
Lab: 138 mg/dL — ABNORMAL HIGH (ref 70–100)
Lab: 118 mg/dL — ABNORMAL HIGH (ref 70–100)
Lab: 141 mg/dL — ABNORMAL HIGH (ref 70–100)

## 2018-06-14 LAB — MAGNESIUM: Lab: 2.3 mg/dL — ABNORMAL LOW (ref 1.6–2.6)

## 2018-06-14 LAB — CBC: Lab: 10 10*3/uL — ABNORMAL LOW (ref >60)

## 2018-06-14 LAB — BASIC METABOLIC PANEL
Lab: 103 MMOL/L — ABNORMAL LOW (ref >60)
Lab: 137 MMOL/L — ABNORMAL LOW (ref >60)

## 2018-06-14 MED ORDER — AMLODIPINE 5 MG PO TAB
5 mg | Freq: Every day | ORAL | 0 refills | Status: DC
Start: 2018-06-14 — End: 2018-06-15
  Administered 2018-06-14 – 2018-06-15 (×2): 5 mg via ORAL

## 2018-06-14 MED ORDER — HEPARIN, PORCINE (PF) 5,000 UNIT/0.5 ML IJ SYRG
5000 [IU] | SUBCUTANEOUS | 0 refills | Status: DC
Start: 2018-06-14 — End: 2018-06-21
  Administered 2018-06-14 – 2018-06-21 (×20): 5000 [IU] via SUBCUTANEOUS

## 2018-06-15 ENCOUNTER — Inpatient Hospital Stay: Admit: 2018-06-15 | Discharge: 2018-06-15 | Payer: MEDICARE | Primary: Family

## 2018-06-15 ENCOUNTER — Inpatient Hospital Stay: Admit: 2018-06-16 | Discharge: 2018-06-16 | Payer: MEDICARE | Primary: Family

## 2018-06-15 DIAGNOSIS — I161 Hypertensive emergency: Secondary | ICD-10-CM

## 2018-06-15 LAB — POC GLUCOSE
Lab: 80 mg/dL (ref 70–100)
Lab: 95 mg/dL (ref 70–100)
Lab: 103 mg/dL — ABNORMAL HIGH (ref 70–100)
Lab: 116 mg/dL — ABNORMAL HIGH (ref 70–100)

## 2018-06-15 LAB — BASIC METABOLIC PANEL: Lab: 140 MMOL/L — ABNORMAL LOW (ref >60)

## 2018-06-15 LAB — CBC: Lab: 10 K/UL — ABNORMAL HIGH (ref 4.5–11.0)

## 2018-06-15 MED ORDER — BISACODYL 10 MG RE SUPP
10 mg | Freq: Every day | RECTAL | 0 refills | Status: DC
Start: 2018-06-15 — End: 2018-06-16
  Administered 2018-06-15 – 2018-06-16 (×2): 10 mg via RECTAL

## 2018-06-15 MED ORDER — NALOXEGOL 12.5 MG PO TAB
12.5 mg | Freq: Once | ORAL | 0 refills | Status: CP
Start: 2018-06-15 — End: ?
  Administered 2018-06-15: 23:00:00 12.5 mg via ORAL

## 2018-06-15 MED ORDER — ACETAMINOPHEN 325 MG PO TAB
650 mg | ORAL | 0 refills | Status: CN | PRN
Start: 2018-06-15 — End: ?

## 2018-06-15 MED ORDER — CLOPIDOGREL 75 MG PO TAB
75 mg | Freq: Every day | ORAL | 0 refills | Status: DC
Start: 2018-06-15 — End: 2018-06-21
  Administered 2018-06-15 – 2018-06-21 (×6): 75 mg via ORAL

## 2018-06-15 MED ORDER — AMLODIPINE 10 MG PO TAB
10 mg | Freq: Every day | ORAL | 0 refills | Status: DC
Start: 2018-06-15 — End: 2018-06-21
  Administered 2018-06-17 – 2018-06-21 (×5): 10 mg via ORAL

## 2018-06-15 MED ORDER — METOCLOPRAMIDE HCL 5 MG/ML IJ SOLN
10 mg | INTRAVENOUS | 0 refills | Status: DC
Start: 2018-06-15 — End: 2018-06-19
  Administered 2018-06-15 – 2018-06-19 (×15): 10 mg via INTRAVENOUS

## 2018-06-16 LAB — CBC
Lab: 11 K/UL — ABNORMAL HIGH (ref 4.5–11.0)
Lab: 15 % — ABNORMAL HIGH (ref 11–15)
Lab: 199 K/UL — ABNORMAL LOW (ref >60)
Lab: 2.8 M/UL — ABNORMAL LOW (ref 4.0–5.0)
Lab: 25 % — ABNORMAL LOW (ref 36–45)
Lab: 30 pg — ABNORMAL HIGH (ref 26–34)
Lab: 34 g/dL — ABNORMAL HIGH (ref 32.0–36.0)
Lab: 8.7 FL — ABNORMAL LOW (ref >60)
Lab: 8.7 g/dL — ABNORMAL LOW (ref 12.0–15.0)
Lab: 88 FL — ABNORMAL HIGH (ref 80–100)

## 2018-06-16 LAB — POC GLUCOSE
Lab: 131 mg/dL — ABNORMAL HIGH (ref 70–100)
Lab: 113 mg/dL — ABNORMAL HIGH (ref 70–100)
Lab: 112 mg/dL — ABNORMAL HIGH (ref 70–100)
Lab: 122 mg/dL — ABNORMAL HIGH (ref 70–100)
Lab: 93 mg/dL (ref 70–100)

## 2018-06-16 LAB — MAGNESIUM
Lab: 2.2 mg/dL (ref 1.6–2.6)
Lab: 2.2 mg/dL (ref 1.6–2.6)

## 2018-06-16 LAB — POTASSIUM: Lab: 4 MMOL/L (ref 3.5–5.1)

## 2018-06-16 LAB — BASIC METABOLIC PANEL: Lab: 137 MMOL/L (ref 137–147)

## 2018-06-16 MED ORDER — CARVEDILOL 25 MG PO TAB
25 mg | Freq: Two times a day (BID) | ORAL | 0 refills | Status: DC
Start: 2018-06-16 — End: 2018-06-21
  Administered 2018-06-17 – 2018-06-21 (×10): 25 mg via ORAL

## 2018-06-16 MED ORDER — LACTATED RINGERS IV SOLP
INTRAVENOUS | 0 refills | Status: DC
Start: 2018-06-16 — End: 2018-06-17
  Administered 2018-06-16 – 2018-06-17 (×2): 1000.000 mL via INTRAVENOUS

## 2018-06-16 MED ORDER — BISACODYL 10 MG RE SUPP
10 mg | Freq: Two times a day (BID) | RECTAL | 0 refills | Status: DC
Start: 2018-06-16 — End: 2018-06-18
  Administered 2018-06-17 – 2018-06-18 (×4): 10 mg via RECTAL

## 2018-06-17 DIAGNOSIS — I161 Hypertensive emergency: Secondary | ICD-10-CM

## 2018-06-17 LAB — BASIC METABOLIC PANEL
Lab: 1.8 mg/dL — ABNORMAL HIGH (ref 0.4–1.00)
Lab: 101 MMOL/L — ABNORMAL LOW (ref 98–110)
Lab: 138 MMOL/L — ABNORMAL LOW (ref 137–147)
Lab: 14 pg — ABNORMAL HIGH (ref 3–12)
Lab: 23 MMOL/L (ref 21–30)
Lab: 28 mg/dL — ABNORMAL HIGH (ref 7–25)
Lab: 29 mL/min — ABNORMAL LOW (ref >60)
Lab: 3.7 MMOL/L — ABNORMAL LOW (ref 3.5–5.1)
Lab: 35 mL/min — ABNORMAL LOW (ref >60)
Lab: 8.3 mg/dL — ABNORMAL LOW (ref 8.5–10.6)
Lab: 96 mg/dL (ref 70–100)

## 2018-06-17 LAB — POC GLUCOSE
Lab: 97 mg/dL (ref 70–100)
Lab: 89 mg/dL (ref 70–100)
Lab: 108 mg/dL — ABNORMAL HIGH (ref 70–100)
Lab: 107 mg/dL — ABNORMAL HIGH (ref 70–100)

## 2018-06-17 LAB — CBC: Lab: 10 10*3/uL (ref 4.5–11.0)

## 2018-06-17 MED ORDER — DEXTROSE 5%-0.45% SODIUM CHLORIDE & POTASSIUM CHLORIDE 20 MEQ/L IV SOLP
INTRAVENOUS | 0 refills | Status: AC
Start: 2018-06-17 — End: ?
  Administered 2018-06-17 – 2018-06-18 (×3): 1000.000 mL via INTRAVENOUS

## 2018-06-17 MED ORDER — HYDRALAZINE 10 MG PO TAB
5 mg | Freq: Three times a day (TID) | ORAL | 0 refills | Status: DC
Start: 2018-06-17 — End: 2018-06-18
  Administered 2018-06-17 – 2018-06-18 (×3): 5 mg via ORAL

## 2018-06-18 DIAGNOSIS — I161 Hypertensive emergency: Secondary | ICD-10-CM

## 2018-06-18 LAB — CBC AND DIFF
Lab: 0.1 10*3/uL (ref 0–0.20)
Lab: 0.8 10*3/uL — ABNORMAL HIGH (ref 0–0.45)
Lab: 1 % (ref 0–2)
Lab: 1 10*3/uL — ABNORMAL HIGH (ref 0–0.80)
Lab: 11 10*3/uL — ABNORMAL HIGH (ref 4.5–11.0)
Lab: 2.2 10*3/uL (ref 1.0–4.8)
Lab: 7 % — ABNORMAL HIGH (ref 0–5)
Lab: 7.8 10*3/uL — ABNORMAL HIGH (ref 1.8–7.0)
Lab: 9 % (ref 4–12)

## 2018-06-18 LAB — BASIC METABOLIC PANEL
Lab: 1.7 mg/dL — ABNORMAL HIGH (ref 0.4–1.00)
Lab: 10 pg (ref 3–12)
Lab: 103 MMOL/L — ABNORMAL LOW (ref 98–110)
Lab: 113 mg/dL — ABNORMAL HIGH (ref 70–100)
Lab: 137 MMOL/L — ABNORMAL LOW (ref 137–147)
Lab: 24 MMOL/L (ref 21–30)
Lab: 24 mg/dL — ABNORMAL HIGH (ref 7–25)
Lab: 31 mL/min — ABNORMAL LOW (ref >60)
Lab: 38 mL/min — ABNORMAL LOW (ref >60)
Lab: 4 MMOL/L — ABNORMAL LOW (ref 3.5–5.1)
Lab: 8.3 mg/dL — ABNORMAL LOW (ref 8.5–10.6)

## 2018-06-18 LAB — POC GLUCOSE
Lab: 116 mg/dL — ABNORMAL HIGH (ref 70–100)
Lab: 106 mg/dL — ABNORMAL HIGH (ref 70–100)
Lab: 200 mg/dL — ABNORMAL HIGH (ref 70–100)
Lab: 90 mg/dL (ref 70–100)

## 2018-06-18 MED ORDER — HYDRALAZINE 10 MG PO TAB
10 mg | Freq: Three times a day (TID) | ORAL | 0 refills | Status: DC
Start: 2018-06-18 — End: 2018-06-19
  Administered 2018-06-18 – 2018-06-19 (×3): 10 mg via ORAL

## 2018-06-18 MED ORDER — BISACODYL 10 MG RE SUPP
10 mg | Freq: Two times a day (BID) | RECTAL | 0 refills | Status: DC
Start: 2018-06-18 — End: 2018-06-21

## 2018-06-19 ENCOUNTER — Inpatient Hospital Stay: Admit: 2018-06-19 | Discharge: 2018-06-19 | Payer: MEDICARE | Primary: Family

## 2018-06-19 DIAGNOSIS — I161 Hypertensive emergency: Secondary | ICD-10-CM

## 2018-06-19 LAB — CBC
Lab: 10 K/UL (ref 4.5–11.0)
Lab: 24 % — ABNORMAL LOW (ref 36–45)
Lab: 29 pg — ABNORMAL LOW (ref 26–34)
Lab: 33 g/dL — ABNORMAL LOW (ref >60)
Lab: 8.1 g/dL — ABNORMAL LOW (ref 12.0–15.0)
Lab: 88 FL — ABNORMAL HIGH (ref 80–100)

## 2018-06-19 LAB — POC GLUCOSE
Lab: 109 mg/dL — ABNORMAL HIGH (ref 70–100)
Lab: 94 mg/dL — ABNORMAL HIGH (ref 70–100)
Lab: 119 mg/dL — ABNORMAL HIGH (ref 70–100)
Lab: 107 mg/dL — ABNORMAL HIGH (ref >60)

## 2018-06-19 LAB — BASIC METABOLIC PANEL
Lab: 107 MMOL/L (ref 98–110)
Lab: 138 MMOL/L (ref >60)
Lab: 4.4 MMOL/L (ref >60)

## 2018-06-19 MED ORDER — HYDRALAZINE 10 MG PO TAB
15 mg | Freq: Three times a day (TID) | ORAL | 0 refills | Status: DC
Start: 2018-06-19 — End: 2018-06-21
  Administered 2018-06-19 – 2018-06-21 (×7): 15 mg via ORAL

## 2018-06-20 ENCOUNTER — Encounter: Admit: 2018-06-20 | Discharge: 2018-06-20 | Payer: MEDICARE | Primary: Family

## 2018-06-20 ENCOUNTER — Inpatient Hospital Stay: Admit: 2018-06-20 | Discharge: 2018-06-20 | Payer: MEDICARE | Primary: Family

## 2018-06-20 DIAGNOSIS — I161 Hypertensive emergency: Secondary | ICD-10-CM

## 2018-06-20 LAB — POC GLUCOSE
Lab: 111 mg/dL — ABNORMAL HIGH (ref 70–100)
Lab: 78 mg/dL (ref 70–100)
Lab: 113 mg/dL — ABNORMAL HIGH (ref 70–100)

## 2018-06-20 LAB — MAGNESIUM: Lab: 1.8 mg/dL — ABNORMAL LOW (ref 1.6–2.6)

## 2018-06-20 LAB — CBC: Lab: 11 K/UL (ref 4.5–11.0)

## 2018-06-20 LAB — BASIC METABOLIC PANEL: Lab: 138 MMOL/L — ABNORMAL LOW (ref 137–147)

## 2018-06-20 MED ORDER — HYDRALAZINE 10 MG PO TAB
15 mg | ORAL_TABLET | Freq: Three times a day (TID) | ORAL | 0 refills | 30.00000 days | Status: AC
Start: 2018-06-20 — End: 2018-06-21

## 2018-06-20 MED ORDER — CLOPIDOGREL 75 MG PO TAB
75 mg | ORAL_TABLET | Freq: Every day | ORAL | 0 refills | 30.00000 days | Status: AC
Start: 2018-06-20 — End: 2018-06-21
  Filled 2018-06-20: qty 4, 4d supply
  Filled 2018-06-21 (×2): qty 4, 4d supply, fill #1

## 2018-06-20 MED ORDER — ACETAMINOPHEN 325 MG PO TAB
650 mg | ORAL | 0 refills | Status: AC | PRN
Start: 2018-06-20 — End: ?

## 2018-06-20 MED ORDER — METHOCARBAMOL 500 MG PO TAB
750 mg | Freq: Once | ORAL | 0 refills | Status: CP
Start: 2018-06-20 — End: ?
  Administered 2018-06-21: 05:00:00 750 mg via ORAL

## 2018-06-20 MED ORDER — SENNOSIDES-DOCUSATE SODIUM 8.6-50 MG PO TAB
2 | ORAL_TABLET | Freq: Two times a day (BID) | ORAL | 0 refills | Status: AC
Start: 2018-06-20 — End: 2018-06-21

## 2018-06-20 MED ORDER — CARVEDILOL 25 MG PO TAB
25 mg | ORAL_TABLET | Freq: Two times a day (BID) | ORAL | 0 refills | 30.00000 days | Status: AC
Start: 2018-06-20 — End: 2018-06-21
  Filled 2018-06-21 (×2): qty 180, 90d supply, fill #1

## 2018-06-20 MED ORDER — AMLODIPINE 10 MG PO TAB
10 mg | ORAL_TABLET | Freq: Every day | ORAL | 0 refills | Status: AC
Start: 2018-06-20 — End: 2018-06-21
  Filled 2018-06-21 (×2): qty 90, 90d supply, fill #1

## 2018-06-21 ENCOUNTER — Inpatient Hospital Stay: Admit: 2018-06-19 | Discharge: 2018-06-19 | Payer: MEDICARE | Primary: Family

## 2018-06-21 ENCOUNTER — Inpatient Hospital Stay: Admit: 2018-06-15 | Discharge: 2018-06-15 | Payer: MEDICARE | Primary: Family

## 2018-06-21 ENCOUNTER — Inpatient Hospital Stay: Admit: 2018-06-06 | Discharge: 2018-06-06 | Payer: MEDICARE | Primary: Family

## 2018-06-21 ENCOUNTER — Inpatient Hospital Stay: Admit: 2018-06-13 | Discharge: 2018-06-13 | Payer: MEDICARE | Primary: Family

## 2018-06-21 ENCOUNTER — Emergency Department: Admit: 2018-06-04 | Discharge: 2018-06-04 | Payer: MEDICARE

## 2018-06-21 ENCOUNTER — Inpatient Hospital Stay: Admit: 2018-06-12 | Discharge: 2018-06-12 | Payer: MEDICARE | Primary: Family

## 2018-06-21 ENCOUNTER — Inpatient Hospital Stay: Admit: 2018-06-11 | Discharge: 2018-06-11 | Payer: MEDICARE | Primary: Family

## 2018-06-21 ENCOUNTER — Inpatient Hospital Stay: Admit: 2018-06-20 | Discharge: 2018-06-20 | Payer: MEDICARE | Primary: Family

## 2018-06-21 ENCOUNTER — Inpatient Hospital Stay: Admit: 2018-06-18 | Discharge: 2018-06-18 | Payer: MEDICARE | Primary: Family

## 2018-06-21 ENCOUNTER — Inpatient Hospital Stay: Admit: 2018-06-04 | Discharge: 2018-06-21 | Disposition: A | Discharge status: Home Health | Payer: MEDICARE

## 2018-06-21 ENCOUNTER — Inpatient Hospital Stay: Admit: 2018-06-05 | Discharge: 2018-06-05 | Payer: MEDICARE | Primary: Family

## 2018-06-21 ENCOUNTER — Ambulatory Visit: Admit: 2018-06-04 | Discharge: 2018-06-05 | Primary: Family

## 2018-06-21 ENCOUNTER — Inpatient Hospital Stay: Admit: 2018-06-17 | Discharge: 2018-06-17 | Payer: MEDICARE | Primary: Family

## 2018-06-21 ENCOUNTER — Encounter: Admit: 2018-06-21 | Discharge: 2018-06-21 | Payer: MEDICARE | Primary: Family

## 2018-06-21 ENCOUNTER — Inpatient Hospital Stay: Admit: 2018-06-08 | Discharge: 2018-06-08 | Payer: MEDICARE | Primary: Family

## 2018-06-21 ENCOUNTER — Inpatient Hospital Stay: Admit: 2018-06-14 | Discharge: 2018-06-14 | Payer: MEDICARE | Primary: Family

## 2018-06-21 DIAGNOSIS — I161 Hypertensive emergency: Secondary | ICD-10-CM

## 2018-06-21 DIAGNOSIS — I674 Hypertensive encephalopathy: ICD-10-CM

## 2018-06-21 DIAGNOSIS — E1122 Type 2 diabetes mellitus with diabetic chronic kidney disease: ICD-10-CM

## 2018-06-21 DIAGNOSIS — F1290 Cannabis use, unspecified, uncomplicated: ICD-10-CM

## 2018-06-21 DIAGNOSIS — E875 Hyperkalemia: ICD-10-CM

## 2018-06-21 DIAGNOSIS — I361 Nonrheumatic tricuspid (valve) insufficiency: ICD-10-CM

## 2018-06-21 DIAGNOSIS — N179 Acute kidney failure, unspecified: ICD-10-CM

## 2018-06-21 DIAGNOSIS — Z8673 Personal history of transient ischemic attack (TIA), and cerebral infarction without residual deficits: ICD-10-CM

## 2018-06-21 DIAGNOSIS — D684 Acquired coagulation factor deficiency: ICD-10-CM

## 2018-06-21 DIAGNOSIS — Z91041 Radiographic dye allergy status: ICD-10-CM

## 2018-06-21 DIAGNOSIS — Z881 Allergy status to other antibiotic agents status: ICD-10-CM

## 2018-06-21 DIAGNOSIS — Z7902 Long term (current) use of antithrombotics/antiplatelets: ICD-10-CM

## 2018-06-21 DIAGNOSIS — N184 Chronic kidney disease, stage 4 (severe): ICD-10-CM

## 2018-06-21 DIAGNOSIS — I21A1 Myocardial infarction type 2: ICD-10-CM

## 2018-06-21 DIAGNOSIS — D696 Thrombocytopenia, unspecified: ICD-10-CM

## 2018-06-21 DIAGNOSIS — D62 Acute posthemorrhagic anemia: ICD-10-CM

## 2018-06-21 DIAGNOSIS — R570 Cardiogenic shock: ICD-10-CM

## 2018-06-21 DIAGNOSIS — N319 Neuromuscular dysfunction of bladder, unspecified: ICD-10-CM

## 2018-06-21 DIAGNOSIS — Z7982 Long term (current) use of aspirin: ICD-10-CM

## 2018-06-21 DIAGNOSIS — I129 Hypertensive chronic kidney disease with stage 1 through stage 4 chronic kidney disease, or unspecified chronic kidney disease: ICD-10-CM

## 2018-06-21 DIAGNOSIS — F319 Bipolar disorder, unspecified: ICD-10-CM

## 2018-06-21 DIAGNOSIS — N390 Urinary tract infection, site not specified: ICD-10-CM

## 2018-06-21 DIAGNOSIS — K567 Ileus, unspecified: ICD-10-CM

## 2018-06-21 DIAGNOSIS — K56609 Unspecified intestinal obstruction, unspecified as to partial versus complete obstruction: ICD-10-CM

## 2018-06-21 DIAGNOSIS — G35 Multiple sclerosis: ICD-10-CM

## 2018-06-21 DIAGNOSIS — B9689 Other specified bacterial agents as the cause of diseases classified elsewhere: ICD-10-CM

## 2018-06-21 DIAGNOSIS — E785 Hyperlipidemia, unspecified: ICD-10-CM

## 2018-06-21 DIAGNOSIS — E871 Hypo-osmolality and hyponatremia: ICD-10-CM

## 2018-06-21 DIAGNOSIS — I313 Pericardial effusion (noninflammatory): ICD-10-CM

## 2018-06-21 DIAGNOSIS — I701 Atherosclerosis of renal artery: ICD-10-CM

## 2018-06-21 DIAGNOSIS — I25110 Atherosclerotic heart disease of native coronary artery with unstable angina pectoris: Principal | ICD-10-CM

## 2018-06-21 LAB — POC GLUCOSE
Lab: 105 mg/dL — ABNORMAL HIGH (ref 70–100)
Lab: 113 mg/dL — ABNORMAL HIGH (ref 70–100)
Lab: 115 mg/dL — ABNORMAL HIGH (ref 70–100)
Lab: 128 mg/dL — ABNORMAL HIGH (ref 70–100)

## 2018-06-21 LAB — MAGNESIUM: Lab: 1.7 mg/dL (ref 1.6–2.6)

## 2018-06-21 LAB — CBC: Lab: 11 K/UL — ABNORMAL LOW (ref 4.5–11.0)

## 2018-06-21 LAB — BASIC METABOLIC PANEL: Lab: 137 MMOL/L — ABNORMAL LOW (ref >60)

## 2018-06-21 MED ORDER — CARVEDILOL 25 MG PO TAB
25 mg | ORAL_TABLET | Freq: Two times a day (BID) | ORAL | 1 refills | 30.00000 days | Status: AC
Start: 2018-06-21 — End: 2018-12-06

## 2018-06-21 MED ORDER — CLOPIDOGREL 75 MG PO TAB
75 mg | ORAL_TABLET | Freq: Every day | ORAL | 0 refills | 30.00000 days | Status: AC
Start: 2018-06-21 — End: ?

## 2018-06-21 MED ORDER — AMLODIPINE 10 MG PO TAB
10 mg | ORAL_TABLET | Freq: Every day | ORAL | 1 refills | Status: AC
Start: 2018-06-21 — End: 2018-07-03

## 2018-06-21 MED ORDER — SENNOSIDES-DOCUSATE SODIUM 8.6-50 MG PO TAB
2 | ORAL_TABLET | Freq: Two times a day (BID) | ORAL | 0 refills | Status: DC
Start: 2018-06-21 — End: 2019-05-16

## 2018-06-21 MED ORDER — HYDRALAZINE 10 MG PO TAB
20 mg | Freq: Three times a day (TID) | ORAL | 0 refills | Status: DC
Start: 2018-06-21 — End: 2018-06-21
  Administered 2018-06-21: 20:00:00 20 mg via ORAL

## 2018-06-21 MED ORDER — HYDRALAZINE 10 MG PO TAB
20 mg | ORAL_TABLET | Freq: Three times a day (TID) | ORAL | 1 refills | 30.00000 days | Status: AC
Start: 2018-06-21 — End: 2018-07-03
  Filled 2018-06-21: qty 180, 30d supply, fill #1

## 2018-06-21 MED ORDER — CYCLOBENZAPRINE 10 MG PO TAB
10 mg | Freq: Three times a day (TID) | ORAL | 0 refills | Status: DC
Start: 2018-06-21 — End: 2018-06-21
  Administered 2018-06-21 (×2): 10 mg via ORAL

## 2018-06-21 MED ORDER — HYDRALAZINE 10 MG PO TAB
20 mg | Freq: Three times a day (TID) | ORAL | 0 refills | 30.00000 days | Status: AC
Start: 2018-06-21 — End: 2018-06-21
  Filled 2018-06-20: qty 180, 40d supply
  Filled 2018-06-21: qty 180, 30d supply, fill #1

## 2018-06-28 ENCOUNTER — Encounter: Admit: 2018-06-28 | Discharge: 2018-06-28 | Payer: MEDICARE | Primary: Family

## 2018-06-28 LAB — CBC
Lab: 16 — ABNORMAL HIGH
Lab: 27 — ABNORMAL LOW
Lab: 28
Lab: 30 — ABNORMAL LOW
Lab: 8 — ABNORMAL HIGH
Lab: 8.4 — ABNORMAL LOW

## 2018-06-28 LAB — BNP (B-TYPE NATRIURETIC PEPTI): Lab: 575 — ABNORMAL HIGH

## 2018-06-28 LAB — BASIC METABOLIC PANEL
Lab: 140
Lab: 19 — ABNORMAL LOW

## 2018-06-29 ENCOUNTER — Ambulatory Visit: Admit: 2018-06-29 | Discharge: 2018-06-30 | Payer: MEDICARE | Primary: Family

## 2018-06-29 ENCOUNTER — Encounter: Admit: 2018-06-29 | Discharge: 2018-06-29 | Payer: MEDICARE | Primary: Family

## 2018-06-29 DIAGNOSIS — G35 Multiple sclerosis: ICD-10-CM

## 2018-06-29 DIAGNOSIS — R609 Edema, unspecified: Secondary | ICD-10-CM

## 2018-06-29 DIAGNOSIS — K5792 Diverticulitis of intestine, part unspecified, without perforation or abscess without bleeding: ICD-10-CM

## 2018-06-29 DIAGNOSIS — R918 Other nonspecific abnormal finding of lung field: Principal | ICD-10-CM

## 2018-06-29 DIAGNOSIS — F319 Bipolar disorder, unspecified: ICD-10-CM

## 2018-06-29 DIAGNOSIS — I701 Atherosclerosis of renal artery: ICD-10-CM

## 2018-06-29 DIAGNOSIS — I10 Essential (primary) hypertension: Secondary | ICD-10-CM

## 2018-06-29 DIAGNOSIS — I129 Hypertensive chronic kidney disease with stage 1 through stage 4 chronic kidney disease, or unspecified chronic kidney disease: Secondary | ICD-10-CM

## 2018-06-29 DIAGNOSIS — E785 Hyperlipidemia, unspecified: ICD-10-CM

## 2018-06-29 DIAGNOSIS — N390 Urinary tract infection, site not specified: ICD-10-CM

## 2018-06-29 DIAGNOSIS — F329 Major depressive disorder, single episode, unspecified: ICD-10-CM

## 2018-06-29 MED ORDER — POTASSIUM CHLORIDE 10 MEQ PO CPER
10 meq | ORAL_CAPSULE | Freq: Every day | ORAL | 0 refills | 30.00000 days | Status: AC
Start: 2018-06-29 — End: 2018-07-05

## 2018-06-29 MED ORDER — FUROSEMIDE 40 MG PO TAB
40 mg | ORAL_TABLET | Freq: Every morning | ORAL | 0 refills | 90.00000 days | Status: AC
Start: 2018-06-29 — End: 2018-07-05

## 2018-06-30 DIAGNOSIS — E876 Hypokalemia: Secondary | ICD-10-CM

## 2018-06-30 DIAGNOSIS — I701 Atherosclerosis of renal artery: ICD-10-CM

## 2018-06-30 DIAGNOSIS — I10 Essential (primary) hypertension: ICD-10-CM

## 2018-06-30 DIAGNOSIS — N184 Chronic kidney disease, stage 4 (severe): ICD-10-CM

## 2018-06-30 DIAGNOSIS — I25118 Atherosclerotic heart disease of native coronary artery with other forms of angina pectoris: Principal | ICD-10-CM

## 2018-06-30 DIAGNOSIS — Z955 Presence of coronary angioplasty implant and graft: ICD-10-CM

## 2018-06-30 DIAGNOSIS — R600 Localized edema: ICD-10-CM

## 2018-07-02 ENCOUNTER — Encounter: Admit: 2018-07-02 | Discharge: 2018-07-02 | Payer: MEDICARE | Primary: Family

## 2018-07-02 DIAGNOSIS — R609 Edema, unspecified: Principal | ICD-10-CM

## 2018-07-02 DIAGNOSIS — E876 Hypokalemia: ICD-10-CM

## 2018-07-02 DIAGNOSIS — I10 Essential (primary) hypertension: ICD-10-CM

## 2018-07-02 LAB — BASIC METABOLIC PANEL
Lab: 140
Lab: 26
Lab: 15 — ABNORMAL HIGH (ref 0–14)
Lab: 2.2 — ABNORMAL HIGH (ref 0.57–1.11)
Lab: 25 — ABNORMAL HIGH (ref 7.0–18.7)
Lab: 9

## 2018-07-02 LAB — BNP (B-TYPE NATRIURETIC PEPTI): Lab: 464 — ABNORMAL HIGH (ref 0–100)

## 2018-07-03 ENCOUNTER — Ambulatory Visit: Admit: 2018-07-03 | Discharge: 2018-07-03 | Payer: MEDICARE | Primary: Family

## 2018-07-03 ENCOUNTER — Encounter: Admit: 2018-07-03 | Discharge: 2018-07-03 | Payer: MEDICARE | Primary: Family

## 2018-07-03 DIAGNOSIS — K5792 Diverticulitis of intestine, part unspecified, without perforation or abscess without bleeding: ICD-10-CM

## 2018-07-03 DIAGNOSIS — I701 Atherosclerosis of renal artery: ICD-10-CM

## 2018-07-03 DIAGNOSIS — I25118 Atherosclerotic heart disease of native coronary artery with other forms of angina pectoris: ICD-10-CM

## 2018-07-03 DIAGNOSIS — E785 Hyperlipidemia, unspecified: ICD-10-CM

## 2018-07-03 DIAGNOSIS — I10 Essential (primary) hypertension: Secondary | ICD-10-CM

## 2018-07-03 DIAGNOSIS — N390 Urinary tract infection, site not specified: ICD-10-CM

## 2018-07-03 DIAGNOSIS — R918 Other nonspecific abnormal finding of lung field: Principal | ICD-10-CM

## 2018-07-03 DIAGNOSIS — G35 Multiple sclerosis: ICD-10-CM

## 2018-07-03 DIAGNOSIS — F319 Bipolar disorder, unspecified: ICD-10-CM

## 2018-07-03 DIAGNOSIS — F329 Major depressive disorder, single episode, unspecified: ICD-10-CM

## 2018-07-03 MED ORDER — HYDRALAZINE 10 MG PO TAB
30 mg | ORAL_TABLET | Freq: Three times a day (TID) | ORAL | 1 refills | 30.00000 days | Status: AC
Start: 2018-07-03 — End: 2018-07-05

## 2018-07-04 ENCOUNTER — Ambulatory Visit: Admit: 2018-07-04 | Discharge: 2018-07-04 | Payer: MEDICARE | Primary: Family

## 2018-07-04 DIAGNOSIS — I10 Essential (primary) hypertension: Principal | ICD-10-CM

## 2018-07-04 DIAGNOSIS — I25118 Atherosclerotic heart disease of native coronary artery with other forms of angina pectoris: Secondary | ICD-10-CM

## 2018-07-04 MED ORDER — PERFLUTREN LIPID MICROSPHERES 1.1 MG/ML IV SUSP
1-20 mL | Freq: Once | INTRAVENOUS | 0 refills | Status: CP | PRN
Start: 2018-07-04 — End: ?
  Administered 2018-07-04: 22:00:00 2 mL via INTRAVENOUS

## 2018-07-05 ENCOUNTER — Encounter: Admit: 2018-07-05 | Discharge: 2018-07-05 | Payer: MEDICARE | Primary: Family

## 2018-07-05 ENCOUNTER — Ambulatory Visit: Admit: 2018-07-05 | Discharge: 2018-07-06 | Payer: MEDICARE | Primary: Family

## 2018-07-05 DIAGNOSIS — N390 Urinary tract infection, site not specified: ICD-10-CM

## 2018-07-05 DIAGNOSIS — I10 Essential (primary) hypertension: Principal | ICD-10-CM

## 2018-07-05 DIAGNOSIS — I255 Ischemic cardiomyopathy: ICD-10-CM

## 2018-07-05 DIAGNOSIS — E785 Hyperlipidemia, unspecified: ICD-10-CM

## 2018-07-05 DIAGNOSIS — I701 Atherosclerosis of renal artery: ICD-10-CM

## 2018-07-05 DIAGNOSIS — R918 Other nonspecific abnormal finding of lung field: Principal | ICD-10-CM

## 2018-07-05 DIAGNOSIS — I25118 Atherosclerotic heart disease of native coronary artery with other forms of angina pectoris: ICD-10-CM

## 2018-07-05 DIAGNOSIS — F319 Bipolar disorder, unspecified: ICD-10-CM

## 2018-07-05 DIAGNOSIS — E8770 Fluid overload, unspecified: ICD-10-CM

## 2018-07-05 DIAGNOSIS — Z951 Presence of aortocoronary bypass graft: ICD-10-CM

## 2018-07-05 DIAGNOSIS — I21A1 Myocardial infarction type 2: ICD-10-CM

## 2018-07-05 DIAGNOSIS — F329 Major depressive disorder, single episode, unspecified: ICD-10-CM

## 2018-07-05 DIAGNOSIS — G35 Multiple sclerosis: ICD-10-CM

## 2018-07-05 DIAGNOSIS — K5792 Diverticulitis of intestine, part unspecified, without perforation or abscess without bleeding: ICD-10-CM

## 2018-07-05 LAB — BASIC METABOLIC PANEL
Lab: 16 — ABNORMAL HIGH (ref 0–14)
Lab: 2.4 — ABNORMAL HIGH (ref 0.57–1.11)
Lab: 29
Lab: 29 — ABNORMAL HIGH (ref 7.0–18.7)
Lab: 3.7
Lab: 9.1
Lab: 99
Lab: 105
Lab: 140

## 2018-07-05 MED ORDER — POTASSIUM CHLORIDE 10 MEQ PO CPER
20 meq | ORAL_CAPSULE | Freq: Every day | ORAL | 3 refills | 30.00000 days | Status: AC
Start: 2018-07-05 — End: 2018-07-16

## 2018-07-05 MED ORDER — FUROSEMIDE 40 MG PO TAB
40 mg | ORAL_TABLET | Freq: Two times a day (BID) | ORAL | 3 refills | 90.00000 days | Status: AC
Start: 2018-07-05 — End: 2018-12-11

## 2018-07-05 MED ORDER — HYDRALAZINE 25 MG PO TAB
25 mg | ORAL_TABLET | Freq: Three times a day (TID) | ORAL | 3 refills | 30.00000 days | Status: AC
Start: 2018-07-05 — End: 2018-10-18

## 2018-07-06 ENCOUNTER — Encounter: Admit: 2018-07-06 | Discharge: 2018-07-06 | Payer: MEDICARE | Primary: Family

## 2018-07-06 DIAGNOSIS — R0602 Shortness of breath: Principal | ICD-10-CM

## 2018-07-10 LAB — BASIC METABOLIC PANEL
Lab: 139
Lab: 18 — ABNORMAL HIGH (ref 0–14)
Lab: 2.7 — ABNORMAL HIGH (ref 0.57–1.11)
Lab: 26
Lab: 31 — ABNORMAL HIGH (ref 7.0–18.7)
Lab: 73
Lab: 9

## 2018-07-10 LAB — MAGNESIUM: Lab: 2.1

## 2018-07-10 LAB — BNP (B-TYPE NATRIURETIC PEPTI): Lab: 169 — ABNORMAL HIGH (ref 0–100)

## 2018-07-11 ENCOUNTER — Encounter: Admit: 2018-07-11 | Discharge: 2018-07-11 | Payer: MEDICARE | Primary: Family

## 2018-07-11 DIAGNOSIS — I10 Essential (primary) hypertension: Principal | ICD-10-CM

## 2018-07-11 DIAGNOSIS — E8770 Fluid overload, unspecified: ICD-10-CM

## 2018-07-11 DIAGNOSIS — I25118 Atherosclerotic heart disease of native coronary artery with other forms of angina pectoris: ICD-10-CM

## 2018-07-16 ENCOUNTER — Encounter: Admit: 2018-07-16 | Discharge: 2018-07-16 | Payer: MEDICARE | Primary: Family

## 2018-07-16 DIAGNOSIS — I10 Essential (primary) hypertension: Principal | ICD-10-CM

## 2018-07-16 DIAGNOSIS — I200 Unstable angina: ICD-10-CM

## 2018-07-16 DIAGNOSIS — I25118 Atherosclerotic heart disease of native coronary artery with other forms of angina pectoris: ICD-10-CM

## 2018-07-16 MED ORDER — POTASSIUM CHLORIDE 10 MEQ PO CPER
40 meq | ORAL_CAPSULE | Freq: Every day | ORAL | 3 refills | 30.00000 days | Status: AC
Start: 2018-07-16 — End: 2018-08-01

## 2018-07-23 LAB — BNP (B-TYPE NATRIURETIC PEPTI): Lab: 170 — ABNORMAL HIGH (ref 0–100)

## 2018-07-23 LAB — BASIC METABOLIC PANEL
Lab: 136
Lab: 14
Lab: 2.5 — ABNORMAL HIGH (ref 0.57–1.11)
Lab: 41 — ABNORMAL HIGH (ref 7.0–18.7)
Lab: 79
Lab: 9.4

## 2018-07-23 LAB — MAGNESIUM: Lab: 1.8 — ABNORMAL LOW (ref 3.5–5.1)

## 2018-07-24 ENCOUNTER — Encounter: Admit: 2018-07-24 | Discharge: 2018-07-24 | Payer: MEDICARE | Primary: Family

## 2018-08-01 MED ORDER — POTASSIUM CHLORIDE 10 MEQ PO CPER
40 meq | ORAL_CAPSULE | Freq: Every day | ORAL | 3 refills | 30.00000 days | Status: AC
Start: 2018-08-01 — End: 2018-12-13

## 2018-08-10 ENCOUNTER — Ambulatory Visit: Admit: 2018-08-10 | Discharge: 2018-08-11 | Payer: MEDICARE | Primary: Family

## 2018-08-10 ENCOUNTER — Encounter: Admit: 2018-08-10 | Discharge: 2018-08-10 | Payer: MEDICARE | Primary: Family

## 2018-08-10 ENCOUNTER — Ambulatory Visit: Admit: 2018-08-10 | Discharge: 2018-08-10 | Payer: MEDICARE | Primary: Family

## 2018-08-10 DIAGNOSIS — E785 Hyperlipidemia, unspecified: ICD-10-CM

## 2018-08-10 DIAGNOSIS — Z951 Presence of aortocoronary bypass graft: ICD-10-CM

## 2018-08-10 DIAGNOSIS — I255 Ischemic cardiomyopathy: ICD-10-CM

## 2018-08-10 DIAGNOSIS — I25118 Atherosclerotic heart disease of native coronary artery with other forms of angina pectoris: ICD-10-CM

## 2018-08-10 DIAGNOSIS — I10 Essential (primary) hypertension: ICD-10-CM

## 2018-08-10 DIAGNOSIS — F329 Major depressive disorder, single episode, unspecified: ICD-10-CM

## 2018-08-10 DIAGNOSIS — Z8679 Personal history of other diseases of the circulatory system: Principal | ICD-10-CM

## 2018-08-10 DIAGNOSIS — R918 Other nonspecific abnormal finding of lung field: Principal | ICD-10-CM

## 2018-08-10 DIAGNOSIS — E8770 Fluid overload, unspecified: ICD-10-CM

## 2018-08-10 DIAGNOSIS — F319 Bipolar disorder, unspecified: ICD-10-CM

## 2018-08-10 DIAGNOSIS — K5792 Diverticulitis of intestine, part unspecified, without perforation or abscess without bleeding: ICD-10-CM

## 2018-08-10 DIAGNOSIS — G35 Multiple sclerosis: ICD-10-CM

## 2018-08-10 DIAGNOSIS — N390 Urinary tract infection, site not specified: ICD-10-CM

## 2018-08-10 DIAGNOSIS — I701 Atherosclerosis of renal artery: ICD-10-CM

## 2018-08-10 LAB — MAGNESIUM: Lab: 2.3 — ABNORMAL LOW (ref 12.0–16.0)

## 2018-08-10 LAB — BASIC METABOLIC PANEL
Lab: 23 — ABNORMAL LOW (ref >60)
Lab: 136 — ABNORMAL LOW (ref 4.20–5.40)
Lab: 19 — ABNORMAL LOW (ref >60)
Lab: 2.6 — ABNORMAL HIGH (ref 0.40–1.10)
Lab: 27 — ABNORMAL HIGH (ref 7–23)
Lab: 3
Lab: 86
Lab: 9

## 2018-08-10 LAB — BNP (B-TYPE NATRIURETIC PEPTI): Lab: 996 — ABNORMAL HIGH (ref <450)

## 2018-08-10 LAB — CBC AND DIFF
Lab: 0.7
Lab: 3
Lab: 8.6
Lab: 1.6
Lab: 19
Lab: 8

## 2018-08-12 ENCOUNTER — Encounter: Admit: 2018-08-12 | Discharge: 2018-08-12 | Payer: MEDICARE | Primary: Family

## 2018-08-12 DIAGNOSIS — I10 Essential (primary) hypertension: ICD-10-CM

## 2018-08-12 DIAGNOSIS — F329 Major depressive disorder, single episode, unspecified: ICD-10-CM

## 2018-08-12 DIAGNOSIS — R918 Other nonspecific abnormal finding of lung field: Principal | ICD-10-CM

## 2018-08-12 DIAGNOSIS — F319 Bipolar disorder, unspecified: ICD-10-CM

## 2018-08-12 DIAGNOSIS — G35 Multiple sclerosis: ICD-10-CM

## 2018-08-12 DIAGNOSIS — N390 Urinary tract infection, site not specified: ICD-10-CM

## 2018-08-12 DIAGNOSIS — E785 Hyperlipidemia, unspecified: ICD-10-CM

## 2018-08-12 DIAGNOSIS — I701 Atherosclerosis of renal artery: ICD-10-CM

## 2018-08-12 DIAGNOSIS — K5792 Diverticulitis of intestine, part unspecified, without perforation or abscess without bleeding: ICD-10-CM

## 2018-09-28 ENCOUNTER — Encounter: Admit: 2018-09-28 | Discharge: 2018-09-28 | Payer: MEDICARE | Primary: Family

## 2018-09-28 DIAGNOSIS — R918 Other nonspecific abnormal finding of lung field: Principal | ICD-10-CM

## 2018-09-28 DIAGNOSIS — I701 Atherosclerosis of renal artery: ICD-10-CM

## 2018-09-28 DIAGNOSIS — E785 Hyperlipidemia, unspecified: ICD-10-CM

## 2018-09-28 DIAGNOSIS — G35 Multiple sclerosis: ICD-10-CM

## 2018-09-28 DIAGNOSIS — K5792 Diverticulitis of intestine, part unspecified, without perforation or abscess without bleeding: ICD-10-CM

## 2018-09-28 DIAGNOSIS — N390 Urinary tract infection, site not specified: ICD-10-CM

## 2018-09-28 DIAGNOSIS — F329 Major depressive disorder, single episode, unspecified: ICD-10-CM

## 2018-09-28 DIAGNOSIS — I10 Essential (primary) hypertension: ICD-10-CM

## 2018-09-28 DIAGNOSIS — T50905A Adverse effect of unspecified drugs, medicaments and biological substances, initial encounter: ICD-10-CM

## 2018-09-28 DIAGNOSIS — F319 Bipolar disorder, unspecified: ICD-10-CM

## 2018-10-15 LAB — BASIC METABOLIC PANEL: Lab: 2.4 — ABNORMAL HIGH (ref 0.57–1.11)

## 2018-10-18 ENCOUNTER — Ambulatory Visit: Admit: 2018-10-18 | Discharge: 2018-10-19 | Payer: MEDICARE | Primary: Family

## 2018-10-18 ENCOUNTER — Encounter: Admit: 2018-10-18 | Discharge: 2018-10-18 | Payer: MEDICARE | Primary: Family

## 2018-10-18 DIAGNOSIS — E785 Hyperlipidemia, unspecified: ICD-10-CM

## 2018-10-18 DIAGNOSIS — F329 Major depressive disorder, single episode, unspecified: ICD-10-CM

## 2018-10-18 DIAGNOSIS — Z951 Presence of aortocoronary bypass graft: ICD-10-CM

## 2018-10-18 DIAGNOSIS — F319 Bipolar disorder, unspecified: ICD-10-CM

## 2018-10-18 DIAGNOSIS — R918 Other nonspecific abnormal finding of lung field: Principal | ICD-10-CM

## 2018-10-18 DIAGNOSIS — I701 Atherosclerosis of renal artery: ICD-10-CM

## 2018-10-18 DIAGNOSIS — I10 Essential (primary) hypertension: Secondary | ICD-10-CM

## 2018-10-18 DIAGNOSIS — K5792 Diverticulitis of intestine, part unspecified, without perforation or abscess without bleeding: ICD-10-CM

## 2018-10-18 DIAGNOSIS — T50905A Adverse effect of unspecified drugs, medicaments and biological substances, initial encounter: ICD-10-CM

## 2018-10-18 DIAGNOSIS — I25118 Atherosclerotic heart disease of native coronary artery with other forms of angina pectoris: Principal | ICD-10-CM

## 2018-10-18 DIAGNOSIS — I255 Ischemic cardiomyopathy: ICD-10-CM

## 2018-10-18 DIAGNOSIS — G35 Multiple sclerosis: ICD-10-CM

## 2018-10-18 DIAGNOSIS — N390 Urinary tract infection, site not specified: ICD-10-CM

## 2018-10-18 MED ORDER — HYDRALAZINE 25 MG PO TAB
50 mg | ORAL_TABLET | Freq: Three times a day (TID) | ORAL | 3 refills | 30.00000 days | Status: AC
Start: 2018-10-18 — End: 2018-12-11

## 2018-10-18 NOTE — Progress Notes
???   oxybutynin chloride (DITROPAN) 5 mg tablet Take 1 tablet by mouth three times daily as needed.   ??? potassium chloride (MICRO-K) 10 mEq capsule Take four capsules by mouth daily. (Patient taking differently: Take 20 mEq by mouth daily.)   ??? senna/docusate (SENOKOT-S) 8.6/50 mg tablet Take two tablets by mouth twice daily.

## 2018-10-19 ENCOUNTER — Encounter: Admit: 2018-10-19 | Discharge: 2018-10-19 | Payer: MEDICARE | Primary: Family

## 2018-10-19 DIAGNOSIS — I25118 Atherosclerotic heart disease of native coronary artery with other forms of angina pectoris: Principal | ICD-10-CM

## 2018-11-14 ENCOUNTER — Encounter: Admit: 2018-11-14 | Discharge: 2018-11-14 | Payer: MEDICARE | Primary: Family

## 2018-11-19 ENCOUNTER — Encounter: Admit: 2018-11-19 | Discharge: 2018-11-19 | Payer: MEDICARE | Primary: Family

## 2018-11-20 ENCOUNTER — Encounter: Admit: 2018-11-20 | Discharge: 2018-11-20 | Payer: MEDICARE | Primary: Family

## 2018-11-29 ENCOUNTER — Encounter: Admit: 2018-11-29 | Discharge: 2018-11-29 | Payer: MEDICARE | Primary: Family

## 2018-11-30 NOTE — Telephone Encounter
[place], Cherlynn Polo, MD  P Cvm Nurse [place]   Caller: Unspecified (Yesterday, ???2:26 PM)      ???      Needs to resume all meds and call us Monday with blood pressure. ???Agree any symptpoms should prompt assessment in ED. ???Would also make sure she is on her anxiety medications. ???Please set me up for ZOOM visit at end of April, or phone call, can be on any of my ESV days.   -JST      Called to discuss with patient.  She states that her BP is still elevated, but she does not have any readings to offer.  willl call back on Monday to follow up on BP readings.  Scheduled pt for telehealth visit with JST on 4/28 at 8:30.  Pt confirmed appointment time and location.  Sent email for pt to set up mychart for telehealth visit.

## 2018-12-03 NOTE — Telephone Encounter
Left message requesting call back to discuss blood pressure.

## 2018-12-04 ENCOUNTER — Encounter: Admit: 2018-12-04 | Discharge: 2018-12-04 | Payer: MEDICARE | Primary: Family

## 2018-12-06 ENCOUNTER — Encounter: Admit: 2018-12-06 | Discharge: 2018-12-06 | Payer: MEDICARE | Primary: Family

## 2018-12-06 MED ORDER — CARVEDILOL 25 MG PO TAB
25 mg | ORAL_TABLET | Freq: Two times a day (BID) | ORAL | 1 refills | 30.00000 days | Status: DC
Start: 2018-12-06 — End: 2019-05-03

## 2018-12-07 ENCOUNTER — Encounter: Admit: 2018-12-07 | Discharge: 2018-12-07 | Payer: MEDICARE | Primary: Family

## 2018-12-11 ENCOUNTER — Encounter: Admit: 2018-12-11 | Discharge: 2018-12-11 | Payer: MEDICARE | Primary: Family

## 2018-12-11 ENCOUNTER — Ambulatory Visit: Admit: 2018-12-11 | Discharge: 2018-12-12 | Payer: MEDICARE | Primary: Family

## 2018-12-11 DIAGNOSIS — K5792 Diverticulitis of intestine, part unspecified, without perforation or abscess without bleeding: ICD-10-CM

## 2018-12-11 DIAGNOSIS — E785 Hyperlipidemia, unspecified: ICD-10-CM

## 2018-12-11 DIAGNOSIS — T50905A Adverse effect of unspecified drugs, medicaments and biological substances, initial encounter: ICD-10-CM

## 2018-12-11 DIAGNOSIS — F329 Major depressive disorder, single episode, unspecified: ICD-10-CM

## 2018-12-11 DIAGNOSIS — I10 Essential (primary) hypertension: ICD-10-CM

## 2018-12-11 DIAGNOSIS — N390 Urinary tract infection, site not specified: ICD-10-CM

## 2018-12-11 DIAGNOSIS — R918 Other nonspecific abnormal finding of lung field: Principal | ICD-10-CM

## 2018-12-11 DIAGNOSIS — G35 Multiple sclerosis: ICD-10-CM

## 2018-12-11 DIAGNOSIS — F319 Bipolar disorder, unspecified: ICD-10-CM

## 2018-12-11 DIAGNOSIS — I701 Atherosclerosis of renal artery: ICD-10-CM

## 2018-12-11 MED ORDER — NITROGLYCERIN 0.4 MG SL SUBL
.4 mg | ORAL_TABLET | SUBLINGUAL | 1 refills | 8.00000 days | Status: DC | PRN
Start: 2018-12-11 — End: 2019-05-30

## 2018-12-11 MED ORDER — HYDRALAZINE 50 MG PO TAB
75 mg | ORAL_TABLET | Freq: Three times a day (TID) | ORAL | 3 refills | 30.00000 days | Status: DC
Start: 2018-12-11 — End: 2019-05-09

## 2018-12-11 NOTE — Progress Notes
Date of Service: 12/11/2018    Debra Hall is a 48 y.o. female.       HPI     This video home visit was conducted via a call between the patient and physician/provider due to COVID-19. . We have found that certain health care needs can be provided without an in-person visit.. This service lets [place] provide the care patients' need.  If a prescription is necessary we can send it directly to their pharmacy.  If testing is necessary this can be arranged.    Obtained patient's verbal consent to treat them and their agreement to [facility] financial policy and NPP via this telehealth visit during the [facility].    Debra Hall is a very pleasant 48 y.o. 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  Today she feels a little bit tired and sometimes depressed, and she continues to grieve the loss of her son who passed away 3 years ago.  She states her other children and grandchildren are her motivation to stay strong and keep going.  She currently denies exertional chest pain, irregular heartbeat/palpitations, dizzy spells, syncope/near syncope, edema, orthopnea, and paroxysmal nocturnal dyspnea.  Home blood pressure ranges all over the place 120-210/50-130, but is usually 140/80 and pulse remains around 80-90.  Weight on home scale has been 140-145 pounds.  She has been exercising, walking on sidewalk 20-30 minutes a few times a day and cleaning the house or doing zoomba with her grandkids.    On Wednesday she has a gynecologic assessment for her history of ovarian cancer and cramping.  She states occasional incontinence remains as does cramping, and she had one episode of diarrhea.  She is been offered Augmentin for frequent urinary tract infections.  She states Cymbalta is managing her depression and she is not really getting too much chest tightness.  Anxious spells do bring on chest discomfort and she claims to be adherent to her medications today with home nursing setting them up weekly.           Vitals:    12/11/18 0828 12/11/18 0833   BP: (!) 217/141 (!) 204/135   BP Source: Arm, Left Upper Arm, Right Upper   Pulse: 90    Weight: 61.2 kg (135 lb)    Height: 1.702 m (5' 7)    PainSc: Zero      Body mass index is 21.14 kg/m???.     Past Medical History  Patient Active Problem List    Diagnosis Date Noted   ??? Ischemic cardiomyopathy 08/12/2018   ??? Volume overload 06/29/2018   ??? 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 Acute blood loss anemia 06/12/2018   ??? Hyperkalemia 06/12/2018   ??? 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 CAD (coronary artery disease), native coronary artery 06/07/2018     06/04/18: Admitted with type 2 NSTEMI due to hypertensive emergency    06/05/18: Cath - 20-30% left main, 40-50% prox-LAD, 70% mid-LAD, 70% mid-to-distal LAD, 80% ostial 1st diag, 80% prox-OM1, 70% OM3, 30-40% prox-to-mid CFX, 70% DISTAL CFX, subtotal occlusion of mid -RCA - CTS consult     ??? Type 2 myocardial infarction (HCC) 06/05/2018   ??? CKD (chronic kidney disease) stage 4, GFR 15-29 ml/min (HCC) 05/29/2018   ??? 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 Urinary tract infection 08/12/2015   ??? Altered mental status 07/05/2010   ??? Bipolar affective disorder (  HCC) 07/05/2010   ??? Essential hypertension 05/07/2009   ??? Renal artery stenosis (HCC) 05/07/2009   ??? Multiple sclerosis (HCC) 05/07/2009   ??? Frequent UTI 05/07/2009   ??? Depression 05/07/2009   ??? Unstable angina (HCC) 05/07/2009         Review of Systems   Constitution: Negative.   HENT: Negative.    Eyes: Negative.    Cardiovascular: Negative.    Respiratory: Negative.    Endocrine: Negative.    Hematologic/Lymphatic: Negative.    Skin: Negative.    Musculoskeletal: Negative.    Gastrointestinal: Negative.    Genitourinary: Negative.    Neurological: Negative.    Psychiatric/Behavioral: Negative.    Allergic/Immunologic: Negative. All other systems reviewed and are negative.      Physical Exam  Vital signs and weight trend as summarized in HPI.  Physical exam limited to what can be assessed by video over internet.  GENERAL APPEARANCE: She is in no apparent distress and appears chronically ill.  CV: JVP not able to be assessed due to poor image quality/lighting    PULM: breathing comfortably, no respiratory distress, not using accessory muscles, no conversational dyspnea   GI:  Abdomen is not obese.  EYES: No scleral icterus, pupils appear equal and round.  ENT: Moist mucous membranes, lips not cyanotic.   SKIN: She does not appear jaundiced.  Exposed skin is in tact without rash.   EXT: No bilateral lower extremity pretibial pitting edema following thumb pressure.  PSYCH: Appropriate, flat affect  NEURO: Alert, oriented to time, place and person. She is conversant, comprehends and answers appropriately without clear memory or language deficits.    CBC w/Diff    Lab Results   Component Value Date/Time    WBC 7.4 10/15/2018    RBC 4.96 10/15/2018    HGB 13.3 10/15/2018    HCT 41.2 10/15/2018    MCV 82.9 10/15/2018    MCH 26.8 (L) 10/15/2018    MCHC 32.4 10/15/2018    RDW 16.1 (H) 10/15/2018    PLTCT 239 10/15/2018    MPV 7.9 06/21/2018 05:32 AM    Lab Results   Component Value Date/Time    NEUT 65 06/18/2018 04:19 AM    ANC 5.97 08/10/2018    LYMA 19 08/10/2018    ALC 1.62 08/10/2018    MONA 8 08/10/2018    AMC 0.70 08/10/2018    EOSA 7 (H) 06/18/2018 04:19 AM    AEC 0.29 08/10/2018    BASA 0 08/10/2018    ABC 0.020 08/10/2018        Comprehensive Metabolic Profile    Lab Results   Component Value Date/Time    NA 141 10/15/2018    K 3.8 10/15/2018    K 3.9 08/10/2018    K 3.0 (L) 07/23/2018    K 3.4 (L) 07/10/2018    CL 104 10/15/2018    CO2 26.0 10/15/2018    GAP 15 (H) 10/15/2018    BUN 27.0 (H) 10/15/2018    CR 2.43 (H) 10/15/2018    CR 2.63 (H) 08/10/2018    CR 2.52 (H) 07/23/2018    CR 2.71 (H) 07/10/2018    GLU 82 10/15/2018 GLU 89 02/02/2005 04:57 AM    Lab Results   Component Value Date/Time    CA 9.8 10/15/2018    PO4 3.9 06/04/2018 02:26 PM    ALBUMIN 3.8 06/11/2018 03:30 AM    TOTPROT 6.6 06/11/2018 03:30 AM    ALKPHOS 137 (H) 06/11/2018 03:30  AM    AST 46 (H) 06/11/2018 03:30 AM    ALT 56 06/11/2018 03:30 AM    TOTBILI 0.3 06/11/2018 03:30 AM    GFR 22.6 (L) 10/15/2018    GFRAA 23 (L) 08/10/2018        Cardiac Enzymes Lipids   Lab Results   Component Value Date/Time    BNP 170 (H) 07/23/2018    BNP 169 (H) 07/10/2018    BNP 464 (H) 07/02/2018    TNI 2.89 (H) 06/05/2018 09:59 AM    TNI 3.18 (H) 06/05/2018 02:08 AM    TNI 0.59 (H) 06/04/2018 08:03 PM    Lab Results   Component Value Date    CHOL 151 05/19/2018    TRIG 128 05/19/2018    HDL 38 (L) 05/19/2018    LDL 93 05/19/2018    VLDL 26 05/19/2018    NONHDLCHOL 113 05/19/2018    CHOLHDLC 4 03/28/2008         Coagulation Endocrine   Lab Results   Component Value Date/Time    INR 1.3 (H) 06/11/2018 03:30 PM    INR 1.2 06/11/2018 01:34 PM    INR 1.0 06/10/2018 03:15 PM    Lab Results   Component Value Date/Time    HGBA1C 5.5 06/06/2018 01:19 PM    HGBA1C 4.8 01/31/2005 05:30 PM    HGBA1C  01/31/2005 05:30 PM     For non-diabetic patients, the normal reference range is 4-6%.  For patients with Type I or Type II Diabetes Mellitus, the ADA recommends  maintaining the A1c level <7%. However, at levels below 5.5%, the risk of  hypoglycemia increases. Patients with an A1c of >9.5% are considered to be  extremely hyperglycemic.    TSH 1.90 06/04/2018 02:26 PM               Cardiovascular Studies      Problems Addressed Today  Encounter Diagnoses   Name Primary?   ??? Ischemic cardiomyopathy Yes   ??? Type 2 myocardial infarction [facility])    ??? Dilated cardiomyopathy (HCC)         Assessment and Plan      Her blood pressure is not at goal, and I spoke with her at length that this will be critical for management of her ischemic cardiomyopathy. I ??? fluticasone propionate (FLONASE) 50 mcg/actuation nasal spray Apply 2 sprays to each nostril as directed daily.   ??? furosemide (LASIX) 40 mg tablet Take one tablet by mouth twice daily. Take for 3 days only with potassium replacement, Friday, Sat and Sunday   ??? hydrALAZINE (APRESOLINE) 25 mg tablet Take two tablets by mouth three times daily.   ??? LORazepam (ATIVAN) 1 mg tablet Take 1 mg by mouth twice daily as needed.   ??? montelukast (SINGULAIR) 10 mg tablet Take 10 mg by mouth at bedtime daily.   ??? oxybutynin chloride (DITROPAN) 5 mg tablet Take 1 tablet by mouth three times daily as needed.   ??? potassium chloride (MICRO-K) 10 mEq capsule Take four capsules by mouth daily. (Patient taking differently: Take 20 mEq by mouth daily.)   ??? senna/docusate (SENOKOT-S) 8.6/50 mg tablet Take two tablets by mouth twice daily.

## 2018-12-12 DIAGNOSIS — I420 Dilated cardiomyopathy: ICD-10-CM

## 2018-12-12 DIAGNOSIS — I255 Ischemic cardiomyopathy: Principal | ICD-10-CM

## 2018-12-12 DIAGNOSIS — I21A1 Myocardial infarction type 2: ICD-10-CM

## 2018-12-13 ENCOUNTER — Encounter: Admit: 2018-12-13 | Discharge: 2018-12-13 | Payer: MEDICARE | Primary: Family

## 2018-12-13 MED ORDER — POTASSIUM CHLORIDE 10 MEQ PO CPER
40 meq | ORAL_CAPSULE | Freq: Every day | ORAL | 5 refills | 30.00000 days | Status: DC
Start: 2018-12-13 — End: 2019-05-06

## 2018-12-19 LAB — BASIC METABOLIC PANEL
Lab: 105
Lab: 106
Lab: 13
Lab: 140
Lab: 2.2 — ABNORMAL HIGH (ref 0.57–1.11)
Lab: 21 — ABNORMAL HIGH (ref 7.0–18.7)
Lab: 24
Lab: 24 — ABNORMAL LOW (ref >59)
Lab: 8.7

## 2018-12-19 LAB — BNP (B-TYPE NATRIURETIC PEPTI): Lab: 166 — ABNORMAL HIGH (ref 0–100)

## 2018-12-19 LAB — MAGNESIUM: Lab: 2.2 — ABNORMAL LOW (ref 3.5–5.1)

## 2018-12-20 ENCOUNTER — Encounter: Admit: 2018-12-20 | Discharge: 2018-12-20 | Payer: MEDICARE | Primary: Family

## 2018-12-20 DIAGNOSIS — I10 Essential (primary) hypertension: Principal | ICD-10-CM

## 2018-12-20 DIAGNOSIS — I25118 Atherosclerotic heart disease of native coronary artery with other forms of angina pectoris: ICD-10-CM

## 2018-12-20 DIAGNOSIS — I255 Ischemic cardiomyopathy: ICD-10-CM

## 2018-12-24 ENCOUNTER — Encounter: Admit: 2018-12-24 | Discharge: 2018-12-24 | Payer: MEDICARE | Primary: Family

## 2018-12-31 ENCOUNTER — Encounter: Admit: 2018-12-31 | Discharge: 2018-12-31 | Payer: MEDICARE | Primary: Family

## 2018-12-31 DIAGNOSIS — G35 Multiple sclerosis: ICD-10-CM

## 2018-12-31 DIAGNOSIS — F319 Bipolar disorder, unspecified: ICD-10-CM

## 2018-12-31 DIAGNOSIS — K5792 Diverticulitis of intestine, part unspecified, without perforation or abscess without bleeding: ICD-10-CM

## 2018-12-31 DIAGNOSIS — I701 Atherosclerosis of renal artery: ICD-10-CM

## 2018-12-31 DIAGNOSIS — F329 Major depressive disorder, single episode, unspecified: ICD-10-CM

## 2018-12-31 DIAGNOSIS — N390 Urinary tract infection, site not specified: ICD-10-CM

## 2018-12-31 DIAGNOSIS — T50905A Adverse effect of unspecified drugs, medicaments and biological substances, initial encounter: ICD-10-CM

## 2018-12-31 DIAGNOSIS — E785 Hyperlipidemia, unspecified: ICD-10-CM

## 2018-12-31 DIAGNOSIS — R918 Other nonspecific abnormal finding of lung field: Principal | ICD-10-CM

## 2018-12-31 DIAGNOSIS — I10 Essential (primary) hypertension: Secondary | ICD-10-CM

## 2019-01-14 ENCOUNTER — Encounter: Admit: 2019-01-14 | Discharge: 2019-01-14 | Primary: Family

## 2019-01-14 DIAGNOSIS — T50905A Adverse effect of unspecified drugs, medicaments and biological substances, initial encounter: Secondary | ICD-10-CM

## 2019-01-14 DIAGNOSIS — I10 Essential (primary) hypertension: Secondary | ICD-10-CM

## 2019-01-14 DIAGNOSIS — F319 Bipolar disorder, unspecified: Secondary | ICD-10-CM

## 2019-01-14 DIAGNOSIS — E785 Hyperlipidemia, unspecified: Secondary | ICD-10-CM

## 2019-01-14 DIAGNOSIS — N390 Urinary tract infection, site not specified: Secondary | ICD-10-CM

## 2019-01-14 DIAGNOSIS — F329 Major depressive disorder, single episode, unspecified: Secondary | ICD-10-CM

## 2019-01-14 DIAGNOSIS — I701 Atherosclerosis of renal artery: Secondary | ICD-10-CM

## 2019-01-14 DIAGNOSIS — G35 Multiple sclerosis: Secondary | ICD-10-CM

## 2019-01-14 DIAGNOSIS — R918 Other nonspecific abnormal finding of lung field: Secondary | ICD-10-CM

## 2019-01-14 DIAGNOSIS — K5792 Diverticulitis of intestine, part unspecified, without perforation or abscess without bleeding: Secondary | ICD-10-CM

## 2019-01-17 ENCOUNTER — Encounter: Admit: 2019-01-17 | Discharge: 2019-01-17 | Primary: Family

## 2019-01-17 ENCOUNTER — Ambulatory Visit: Admit: 2019-01-17 | Discharge: 2019-01-18 | Primary: Family

## 2019-01-17 DIAGNOSIS — T50905A Adverse effect of unspecified drugs, medicaments and biological substances, initial encounter: Secondary | ICD-10-CM

## 2019-01-17 DIAGNOSIS — G35 Multiple sclerosis: Secondary | ICD-10-CM

## 2019-01-17 DIAGNOSIS — E785 Hyperlipidemia, unspecified: Secondary | ICD-10-CM

## 2019-01-17 DIAGNOSIS — N390 Urinary tract infection, site not specified: Secondary | ICD-10-CM

## 2019-01-17 DIAGNOSIS — Z951 Presence of aortocoronary bypass graft: Secondary | ICD-10-CM

## 2019-01-17 DIAGNOSIS — I10 Essential (primary) hypertension: Secondary | ICD-10-CM

## 2019-01-17 DIAGNOSIS — K5792 Diverticulitis of intestine, part unspecified, without perforation or abscess without bleeding: Secondary | ICD-10-CM

## 2019-01-17 DIAGNOSIS — F319 Bipolar disorder, unspecified: Secondary | ICD-10-CM

## 2019-01-17 DIAGNOSIS — F329 Major depressive disorder, single episode, unspecified: Secondary | ICD-10-CM

## 2019-01-17 DIAGNOSIS — I2510 Atherosclerotic heart disease of native coronary artery without angina pectoris: Secondary | ICD-10-CM

## 2019-01-17 DIAGNOSIS — I701 Atherosclerosis of renal artery: Secondary | ICD-10-CM

## 2019-01-17 DIAGNOSIS — I255 Ischemic cardiomyopathy: Secondary | ICD-10-CM

## 2019-01-17 DIAGNOSIS — R918 Other nonspecific abnormal finding of lung field: Secondary | ICD-10-CM

## 2019-01-17 MED ORDER — ISOSORBIDE MONONITRATE 30 MG PO TB24
30 mg | ORAL_TABLET | Freq: Every morning | ORAL | 3 refills | 90.00000 days | Status: DC
Start: 2019-01-17 — End: 2019-01-17

## 2019-01-17 MED ORDER — ISOSORBIDE MONONITRATE 30 MG PO TB24
30 mg | ORAL_TABLET | Freq: Every morning | ORAL | 3 refills | 30.00000 days | Status: DC
Start: 2019-01-17 — End: 2019-10-31

## 2019-01-17 NOTE — Telephone Encounter
lmom with request for quidance of referral to psyc

## 2019-01-17 NOTE — Progress Notes
Date of Service: 01/17/2019    Debra Hall is a 48 y.o. female.       HPI     Debra Hall returns to the advanced heart care clinic in [place] for routine follow-up.  I follow her for ischemic cardiomyopathy and she is status post CABG for stable coronary disease.   Today she reports increase in nitroglycerin use over the past month or so.  In the last week she has used it perhaps 6 days which is a definite increase from her previously stable angina. She enjoys walking and yoga and hand weights, doing exercises daily.  She endorses fatigue and dry mouth as well.  She she currently denies dyspnea on exertion, irregular heartbeat/palpitations, dizzy spells, syncope/near syncope, edema, orthopnea, and paroxysmal nocturnal dyspnea.  Home blood pressure ranges 120-180/70-80s, and pulse remains around 70-80s.  Weight on home scale has been stable.  She was seen in the emergency room at [facility]. Debra Hall on 01/15/2019 for chest pain.  She was eventually admitted to observation.  She had relief of chest pain with fentanyl and nitroglycerin by EMS.  She had called EMS originally because she had taken more than 3 nitro without relief.  She had a chest x-ray which showed chronic interstitial changes and bibasilar atelectasis and sequela of granulomatous disease, all largely unchanged unchanged with large mass in the right lower lobe unchanged.  Her troponin and EKGs were unremarkable, and Lexiscan stress test was also performed with no EKG changes, pulse went from 55 to 100 bpm.  Blood pressure went from 137 over 81-107/72.  She states that blood pressure can be driven by family stress, and she often tries to remove herself from stressful situations..  During the stress test her EF was 65%, there were no wall motion abnormalities.  Perfusion images did not show evidence of ischemia or infarction.  Overall she had a low risk study.  Multiple sclerosis does cause foot drag, right worse then left.  She follows with neurology for her MS.    Her echocardiogram performed back in December 2019 revealed EF 66% with normal RV function and no LV wall motion abnormalities.  There is no significant valvular disease and she appeared to be euvolemic with an IVC revealing normal central venous pressure.  She is had an improvement in the pericardial effusion which was now trivial.     Labs from [facility]. Debra Hall were reviewed and will be scanned into our outside records.           Vitals:    01/17/19 1140 01/17/19 1150   BP: 128/84 136/84   BP Source: Arm, Left Upper Arm, Right Upper   Pulse: 60    SpO2: 99%    Weight: 60.9 kg (134 lb 3.2 oz)    Height: 1.702 m (5' 7)    PainSc: Four      Body mass index is 21.02 kg/m???.     Past Medical History  Patient Active Problem List    Diagnosis Date Noted   ??? Ischemic cardiomyopathy 08/12/2018   ??? Volume overload 06/29/2018   ??? 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 Acute blood loss anemia 06/12/2018   ??? Hyperkalemia 06/12/2018   ??? 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 CAD (coronary artery disease), native coronary artery 06/07/2018     06/04/18: Admitted with type 2 NSTEMI due to hypertensive emergency    06/05/18: Cath - 20-30% left main, 40-50% prox-LAD, 70% mid-LAD, 70% mid-to-distal  LAD, 80% ostial 1st diag, 80% prox-OM1, 70% OM3, 30-40% prox-to-mid CFX, 70% DISTAL CFX, subtotal occlusion of mid -RCA - CTS consult     ??? Type 2 myocardial infarction (HCC) 06/05/2018   ??? CKD (chronic kidney disease) stage 4, GFR 15-29 ml/min (HCC) 05/29/2018   ??? 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 Urinary tract infection 08/12/2015   ??? Altered mental status 07/05/2010   ??? Bipolar affective disorder (HCC) 07/05/2010   ??? Essential hypertension 05/07/2009   ??? Renal artery stenosis (HCC) 05/07/2009   ??? Multiple sclerosis (HCC) 05/07/2009   ??? Frequent UTI 05/07/2009   ??? Depression 05/07/2009   ??? Unstable angina (HCC) 05/07/2009 Review of Systems   Constitution: Negative.   HENT: Negative.    Eyes: Negative.    Cardiovascular: Positive for chest pain, dyspnea on exertion, irregular heartbeat and palpitations.   Respiratory: Negative.    Endocrine: Positive for polydipsia, polyphagia and polyuria.   Hematologic/Lymphatic: Negative.    Skin: Negative.    Musculoskeletal: Positive for back pain, joint pain, muscle weakness and myalgias.   Gastrointestinal: Positive for constipation.   Genitourinary: Positive for bladder incontinence.   Neurological: Positive for headaches and weakness.   Psychiatric/Behavioral: Negative.    Allergic/Immunologic: Positive for environmental allergies.       Physical Exam  Constitutional: She appears well-developed and well-nourished.   HENT:  Head: Normocephalic.   Mouth/Throat: Oropharynx is clear and moist.   Eyes: Conjunctivae are normal.   Neck: Normal range of motion. No JVD present. Normal carotid pulses.  Cardiovascular: Normal rate, regular rhythm, normal heart sounds and intact distal pulses. Exam reveals no gallop and no friction rub.  No murmur heard.  Pulmonary/Chest: Effort normal and breath sounds normal. No respiratory distress. She has no wheezes. She has no rales. She exhibits no chest wall tenderness.   Abdominal: Soft. Bowel sounds are normal. She exhibits no distension. There is no tenderness.   Musculoskeletal: Normal range of motion and muscular tone. She exhibits no edema with no tenderness.   Neurological: She is alert and oriented to person, place, and time. No focal deficits.  Skin: Skin is warm. No erythema.   Psychiatric: She has a normal mood and affect. Judgment, behavior, and thought content normal.       Cardiovascular Studies      Problems Addressed Today  Encounter Diagnoses   Name Primary?   ??? Ischemic cardiomyopathy Yes   ??? S/P CABG x 6    ??? Coronary artery disease involving native coronary artery of native heart without angina pectoris    ??? Essential hypertension Assessment and Plan     Today she is warm euvolemic normotensive with stable NYHA functional class III symptoms.  She is having worsening angina of late and I will increase Imdur 30 mg daily and attempt to decrease the nitroglycerin need.  Per patient request, I will refer to psychiatry so she can have ongoing evaluation for anxiety and depression.  I think anxiety and depression also drive her blood pressure and probably her angina as well.  We will watch her blood pressure and weight trend closely and have her return to see me in 4 to 6 months.  Certainly I will be available before that should she have more frequent chest pain.  Given her normal evaluation I favor hypertension versus microvascular angina/vasospastic angina or endothelial dysfunction as etiologies.    Thank you for allowing me to participate in the care of this patient.  Please  do not hesitate to contact me should you have any questions or concerns.    Baldo Ash, MD, PhD  Advanced Heart Failure and Transplant Cardiology  Pager [phone]       I spent 25 minutes with the patient today including approximately 15 minutes in counseling on her heart disease.  We reviewed the above treatment plan, went over risks/benefits/alternatives to the therapies, and all questions were answered to her satisfaction.      Current Medications (including today's revisions)  ??? acetaminophen (TYLENOL) 325 mg tablet Take two tablets by mouth every 6 hours as needed.   ??? amLODIPine (NORVASC) 5 mg tablet Take 5 mg by mouth daily.   ??? aspirin EC 81 mg tablet Take one tablet by mouth daily. Take with food.   ??? atorvastatin (LIPITOR) 40 mg tablet Take one tablet by mouth at bedtime daily.   ??? carvediloL (COREG) 25 mg tablet Take one tablet by mouth twice daily. Take with food.   ??? cetirizine (ZYRTEC) 10 mg tablet Take 10 mg by mouth as Needed for Allergy symptoms.   ??? clopiDOGrel (PLAVIX) 75 mg tablet Take 75 mg by mouth daily. ??? cyclobenzaprine (FLEXERIL) 10 mg PO tablet Take 10 mg by mouth Three Times Daily  as needed for Muscle Cramps.   ??? diphenhydrAMINE (BENADRYL) 25 mg capsule Take 25 mg by mouth as Needed.   ??? docusate (COLACE) 100 mg capsule Take 100 mg by mouth daily.   ??? duloxetine DR (CYMBALTA) 20 mg capsule Take 20 mg by mouth twice daily.   ??? ergocalciferol (VITAMIN D-2) 50,000 unit capsule Take 1 capsule by mouth twice weekly.   ??? fluticasone propionate (FLONASE) 50 mcg/actuation nasal spray Apply 2 sprays to each nostril as directed as Needed.   ??? hydrALAZINE (APRESOLINE) 50 mg tablet Take 1.5 tablets by mouth three times daily.   ??? LORazepam (ATIVAN) 1 mg tablet Take 1 mg by mouth twice daily as needed.   ??? montelukast (SINGULAIR) 10 mg tablet Take 10 mg by mouth as Needed.   ??? multivit with calcium,iron,min [facility] DAILY MULTIVITAMIN PO) Take 1 tablet by mouth daily.   ??? natalizumab (TYSABRI) 300 mg/15 mL injection Administer 300 mg through vein every 30 days.   ??? nitroglycerin (NITROSTAT) 0.4 mg tablet Place one tablet under tongue every 5 minutes as needed for Chest Pain. Call 9-1-1 if Chest Pain persists after 3 doses   ??? oxybutynin chloride (DITROPAN) 5 mg tablet Take 1 tablet by mouth three times daily.   ??? potassium chloride (MICRO-K) 10 mEq capsule Take four capsules by mouth daily. (Patient taking differently: Take 20 mEq by mouth twice daily.)   ??? senna/docusate (SENOKOT-S) 8.6/50 mg tablet Take two tablets by mouth twice daily.

## 2019-01-17 NOTE — Telephone Encounter
STAT Medical Record Request:  Need records on Debra Hall DOB 07/28/1971.  Need All records associated with last stay.  ER report, labs, radiology reports, EKG, H&P, Consults, Cardiac Testing.  Patient has office visit today. Please fax ASAP [phone]

## 2019-01-21 ENCOUNTER — Encounter: Admit: 2019-01-21 | Discharge: 2019-01-21 | Primary: Family

## 2019-01-21 DIAGNOSIS — R918 Other nonspecific abnormal finding of lung field: Secondary | ICD-10-CM

## 2019-01-21 DIAGNOSIS — I10 Essential (primary) hypertension: Secondary | ICD-10-CM

## 2019-01-21 DIAGNOSIS — T50905A Adverse effect of unspecified drugs, medicaments and biological substances, initial encounter: Secondary | ICD-10-CM

## 2019-01-21 DIAGNOSIS — F329 Major depressive disorder, single episode, unspecified: Secondary | ICD-10-CM

## 2019-01-21 DIAGNOSIS — I701 Atherosclerosis of renal artery: Secondary | ICD-10-CM

## 2019-01-21 DIAGNOSIS — E785 Hyperlipidemia, unspecified: Secondary | ICD-10-CM

## 2019-01-21 DIAGNOSIS — F319 Bipolar disorder, unspecified: Secondary | ICD-10-CM

## 2019-01-21 DIAGNOSIS — K5792 Diverticulitis of intestine, part unspecified, without perforation or abscess without bleeding: Secondary | ICD-10-CM

## 2019-01-21 DIAGNOSIS — N390 Urinary tract infection, site not specified: Secondary | ICD-10-CM

## 2019-01-21 DIAGNOSIS — G35 Multiple sclerosis: Secondary | ICD-10-CM

## 2019-02-11 ENCOUNTER — Encounter: Admit: 2019-02-11 | Discharge: 2019-02-11 | Primary: Family

## 2019-02-11 DIAGNOSIS — T50905A Adverse effect of unspecified drugs, medicaments and biological substances, initial encounter: Secondary | ICD-10-CM

## 2019-02-11 DIAGNOSIS — F329 Major depressive disorder, single episode, unspecified: Secondary | ICD-10-CM

## 2019-02-11 DIAGNOSIS — I10 Essential (primary) hypertension: Secondary | ICD-10-CM

## 2019-02-11 DIAGNOSIS — F319 Bipolar disorder, unspecified: Secondary | ICD-10-CM

## 2019-02-11 DIAGNOSIS — K5792 Diverticulitis of intestine, part unspecified, without perforation or abscess without bleeding: Secondary | ICD-10-CM

## 2019-02-11 DIAGNOSIS — G35 Multiple sclerosis: Secondary | ICD-10-CM

## 2019-02-11 DIAGNOSIS — E785 Hyperlipidemia, unspecified: Secondary | ICD-10-CM

## 2019-02-11 DIAGNOSIS — N390 Urinary tract infection, site not specified: Secondary | ICD-10-CM

## 2019-02-11 DIAGNOSIS — R918 Other nonspecific abnormal finding of lung field: Secondary | ICD-10-CM

## 2019-02-11 DIAGNOSIS — I701 Atherosclerosis of renal artery: Secondary | ICD-10-CM

## 2019-05-02 ENCOUNTER — Encounter: Admit: 2019-05-02 | Discharge: 2019-05-02 | Payer: MEDICARE | Primary: Family

## 2019-05-03 ENCOUNTER — Encounter: Admit: 2019-05-03 | Discharge: 2019-05-03 | Payer: MEDICARE | Primary: Family

## 2019-05-03 MED ORDER — CARVEDILOL 25 MG PO TAB
ORAL_TABLET | Freq: Two times a day (BID) | ORAL | 3 refills | 30.00000 days | Status: DC
Start: 2019-05-03 — End: 2020-02-18

## 2019-05-04 ENCOUNTER — Encounter: Admit: 2019-05-04 | Discharge: 2019-05-04 | Payer: MEDICARE | Primary: Family

## 2019-05-06 MED ORDER — POTASSIUM CHLORIDE 10 MEQ PO CPER
ORAL_CAPSULE | Freq: Every day | 5 refills | 30.00000 days | Status: DC
Start: 2019-05-06 — End: 2019-08-30

## 2019-05-09 ENCOUNTER — Encounter: Admit: 2019-05-09 | Discharge: 2019-05-09 | Payer: MEDICARE | Primary: Family

## 2019-05-09 MED ORDER — HYDRALAZINE 50 MG PO TAB
ORAL_TABLET | Freq: Three times a day (TID) | ORAL | 0 refills | 30.00000 days | Status: DC
Start: 2019-05-09 — End: 2019-08-07

## 2019-05-16 ENCOUNTER — Encounter: Admit: 2019-05-16 | Discharge: 2019-05-16 | Payer: MEDICARE | Primary: Family

## 2019-05-16 DIAGNOSIS — E875 Hyperkalemia: Secondary | ICD-10-CM

## 2019-05-16 DIAGNOSIS — R739 Hyperglycemia, unspecified: Secondary | ICD-10-CM

## 2019-05-16 DIAGNOSIS — I21A1 Myocardial infarction type 2: Secondary | ICD-10-CM

## 2019-05-16 DIAGNOSIS — T50905A Adverse effect of unspecified drugs, medicaments and biological substances, initial encounter: Secondary | ICD-10-CM

## 2019-05-16 DIAGNOSIS — I2510 Atherosclerotic heart disease of native coronary artery without angina pectoris: Secondary | ICD-10-CM

## 2019-05-16 DIAGNOSIS — K5792 Diverticulitis of intestine, part unspecified, without perforation or abscess without bleeding: Secondary | ICD-10-CM

## 2019-05-16 DIAGNOSIS — I255 Ischemic cardiomyopathy: Secondary | ICD-10-CM

## 2019-05-16 DIAGNOSIS — I200 Unstable angina: Secondary | ICD-10-CM

## 2019-05-16 DIAGNOSIS — I10 Essential (primary) hypertension: Secondary | ICD-10-CM

## 2019-05-16 DIAGNOSIS — F329 Major depressive disorder, single episode, unspecified: Secondary | ICD-10-CM

## 2019-05-16 DIAGNOSIS — R918 Other nonspecific abnormal finding of lung field: Secondary | ICD-10-CM

## 2019-05-16 DIAGNOSIS — I701 Atherosclerosis of renal artery: Secondary | ICD-10-CM

## 2019-05-16 DIAGNOSIS — N390 Urinary tract infection, site not specified: Secondary | ICD-10-CM

## 2019-05-16 DIAGNOSIS — Z951 Presence of aortocoronary bypass graft: Secondary | ICD-10-CM

## 2019-05-16 DIAGNOSIS — G35 Multiple sclerosis: Secondary | ICD-10-CM

## 2019-05-16 DIAGNOSIS — F319 Bipolar disorder, unspecified: Secondary | ICD-10-CM

## 2019-05-16 DIAGNOSIS — E785 Hyperlipidemia, unspecified: Secondary | ICD-10-CM

## 2019-05-16 MED ORDER — AMLODIPINE 10 MG PO TAB
10 mg | ORAL_TABLET | Freq: Every day | ORAL | 3 refills | Status: AC
Start: 2019-05-16 — End: ?

## 2019-05-30 ENCOUNTER — Encounter: Admit: 2019-05-30 | Discharge: 2019-05-30 | Payer: MEDICARE | Primary: Family

## 2019-05-30 MED ORDER — NITROGLYCERIN 0.4 MG SL SUBL
ORAL_TABLET | SUBLINGUAL | 3 refills | 8.00000 days | Status: AC | PRN
Start: 2019-05-30 — End: ?

## 2019-06-09 ENCOUNTER — Encounter: Admit: 2019-06-09 | Discharge: 2019-06-09 | Payer: MEDICARE | Primary: Family

## 2019-06-09 DIAGNOSIS — N390 Urinary tract infection, site not specified: Secondary | ICD-10-CM

## 2019-06-09 DIAGNOSIS — T50905A Adverse effect of unspecified drugs, medicaments and biological substances, initial encounter: Secondary | ICD-10-CM

## 2019-06-09 DIAGNOSIS — F319 Bipolar disorder, unspecified: Secondary | ICD-10-CM

## 2019-06-09 DIAGNOSIS — E785 Hyperlipidemia, unspecified: Secondary | ICD-10-CM

## 2019-06-09 DIAGNOSIS — F329 Major depressive disorder, single episode, unspecified: Secondary | ICD-10-CM

## 2019-06-09 DIAGNOSIS — I10 Essential (primary) hypertension: Secondary | ICD-10-CM

## 2019-06-09 DIAGNOSIS — K5792 Diverticulitis of intestine, part unspecified, without perforation or abscess without bleeding: Secondary | ICD-10-CM

## 2019-06-09 DIAGNOSIS — R918 Other nonspecific abnormal finding of lung field: Secondary | ICD-10-CM

## 2019-06-09 DIAGNOSIS — I701 Atherosclerosis of renal artery: Secondary | ICD-10-CM

## 2019-06-09 DIAGNOSIS — G35 Multiple sclerosis: Secondary | ICD-10-CM

## 2019-07-09 ENCOUNTER — Encounter: Admit: 2019-07-09 | Discharge: 2019-07-09 | Payer: MEDICARE | Primary: Family

## 2019-07-10 MED ORDER — AMLODIPINE 5 MG PO TAB
ORAL_TABLET | Freq: Every day | 1 refills | Status: DC
Start: 2019-07-10 — End: 2019-09-05

## 2019-08-06 ENCOUNTER — Encounter: Admit: 2019-08-06 | Discharge: 2019-08-06 | Payer: MEDICARE | Primary: Family

## 2019-08-07 MED ORDER — HYDRALAZINE 50 MG PO TAB
ORAL_TABLET | Freq: Three times a day (TID) | ORAL | 2 refills | 30.00000 days | Status: DC
Start: 2019-08-07 — End: 2019-09-05

## 2019-08-14 ENCOUNTER — Encounter: Admit: 2019-08-14 | Discharge: 2019-08-14 | Payer: MEDICARE | Primary: Family

## 2019-08-15 ENCOUNTER — Encounter: Admit: 2019-08-15 | Discharge: 2019-08-15 | Payer: MEDICARE | Primary: Family

## 2019-08-15 ENCOUNTER — Ambulatory Visit: Admit: 2019-08-15 | Discharge: 2019-08-15 | Payer: MEDICARE | Primary: Family

## 2019-08-15 DIAGNOSIS — T82858D Stenosis of vascular prosthetic devices, implants and grafts, subsequent encounter: Secondary | ICD-10-CM

## 2019-08-15 NOTE — Progress Notes
Stress test performed due to chest pain in [facility]; no ischemia/flow limiting coronary disease seen.  Please make sure patient has results; there is no change in treatment plan.  Return to see me as previously scheduled.

## 2019-08-30 ENCOUNTER — Encounter: Admit: 2019-08-30 | Discharge: 2019-08-30 | Payer: MEDICARE | Primary: Family

## 2019-08-30 MED ORDER — POTASSIUM CHLORIDE 10 MEQ PO CPER
ORAL_CAPSULE | Freq: Every day | 5 refills | 30.00000 days | Status: DC
Start: 2019-08-30 — End: 2019-09-05

## 2019-09-05 ENCOUNTER — Encounter: Admit: 2019-09-05 | Discharge: 2019-09-05 | Payer: MEDICARE | Primary: Family

## 2019-09-05 DIAGNOSIS — I255 Ischemic cardiomyopathy: Secondary | ICD-10-CM

## 2019-09-05 DIAGNOSIS — G35 Multiple sclerosis: Secondary | ICD-10-CM

## 2019-09-05 DIAGNOSIS — I10 Essential (primary) hypertension: Secondary | ICD-10-CM

## 2019-09-05 DIAGNOSIS — K5792 Diverticulitis of intestine, part unspecified, without perforation or abscess without bleeding: Secondary | ICD-10-CM

## 2019-09-05 DIAGNOSIS — F319 Bipolar disorder, unspecified: Secondary | ICD-10-CM

## 2019-09-05 DIAGNOSIS — T50905A Adverse effect of unspecified drugs, medicaments and biological substances, initial encounter: Secondary | ICD-10-CM

## 2019-09-05 DIAGNOSIS — N390 Urinary tract infection, site not specified: Secondary | ICD-10-CM

## 2019-09-05 DIAGNOSIS — E785 Hyperlipidemia, unspecified: Secondary | ICD-10-CM

## 2019-09-05 DIAGNOSIS — N184 Chronic kidney disease, stage 4 (severe): Secondary | ICD-10-CM

## 2019-09-05 DIAGNOSIS — R918 Other nonspecific abnormal finding of lung field: Secondary | ICD-10-CM

## 2019-09-05 DIAGNOSIS — F329 Major depressive disorder, single episode, unspecified: Secondary | ICD-10-CM

## 2019-09-05 DIAGNOSIS — I701 Atherosclerosis of renal artery: Secondary | ICD-10-CM

## 2019-09-05 MED ORDER — FUROSEMIDE 80 MG PO TAB
80 mg | ORAL_TABLET | ORAL | 3 refills | 90.00000 days | Status: DC
Start: 2019-09-05 — End: 2020-03-11

## 2019-09-05 MED ORDER — POTASSIUM CHLORIDE 10 MEQ PO CPER
40 meq | ORAL_CAPSULE | ORAL | 5 refills | 30.00000 days | Status: DC
Start: 2019-09-05 — End: 2020-03-11

## 2019-09-05 MED ORDER — HYDRALAZINE 100 MG PO TAB
100 mg | ORAL_TABLET | Freq: Three times a day (TID) | ORAL | 3 refills | 30.00000 days | Status: AC
Start: 2019-09-05 — End: ?

## 2019-09-05 NOTE — Patient Instructions
1. Med changes today:  Continue lasix at 80 mg twice daily so long as you are swollen. Once weight is 145 pounds, decrease to once daily.  2. Increase hydralazine to 100 mg.  3. Nurse visit in 1 week with labs for weight, BP check.  We will consider iv lasix infusion if the pills do not work.  4. Continue 10 mg amlodipine.  5. Please write down your weight, blood pressure, and heart rate daily and bring this log to all clinic visits.  6. Labs in 1 week: BNP, BMP, Mg  7. Return to see Dr. Adah Perl in 5 months.    My nurse's Telford Nab and Roselyn Reef) number is [phone].  Please call the office with any questions or concerns [phone] (nurse triage).  To schedule or change an appointment call [phone].    Lise Auer, MD, PhD  Center for [place] [facility] Phone: [phone] Fax: [phone]

## 2019-09-10 ENCOUNTER — Encounter: Admit: 2019-09-10 | Discharge: 2019-09-10 | Payer: MEDICARE | Primary: Family

## 2019-09-10 DIAGNOSIS — I255 Ischemic cardiomyopathy: Secondary | ICD-10-CM

## 2019-09-10 DIAGNOSIS — N184 Chronic kidney disease, stage 4 (severe): Secondary | ICD-10-CM

## 2019-09-10 DIAGNOSIS — I10 Essential (primary) hypertension: Secondary | ICD-10-CM

## 2019-09-10 LAB — MAGNESIUM: Lab: 2.2

## 2019-09-10 LAB — BASIC METABOLIC PANEL
Lab: 100
Lab: 140
Lab: 15 — ABNORMAL LOW (ref >59)
Lab: 16 — ABNORMAL HIGH (ref 0–14)
Lab: 28
Lab: 3.2 — ABNORMAL HIGH (ref 0.57–1.11)
Lab: 3.5
Lab: 42 — ABNORMAL HIGH (ref 7.0–18.7)
Lab: 80
Lab: 9

## 2019-09-10 LAB — BNP (B-TYPE NATRIURETIC PEPTI): Lab: 87

## 2019-09-10 NOTE — Progress Notes
Patient is here to follow up on edema, water weight.  Weight is down from office appt but not near her goal at 145lbs.  Bp is under better control.  Patient did bring her weight and bp's.  Sunday could not find bp machine wt 163.  Monday 113/74 wt 160.  Tuesday 151/97 wt 160.  She has mild edema bilat lower ext.  Abnormal labs will route to JST for recommendations.  She states increase in lasix has not increased urine output that much.

## 2019-09-11 ENCOUNTER — Encounter: Admit: 2019-09-11 | Discharge: 2019-09-11 | Payer: MEDICARE | Primary: Family

## 2019-09-11 NOTE — Telephone Encounter
-----   Message from Jamison Neighbor, MD sent at 09/10/2019  5:55 PM CST -----  Spoke with Richardson Landry.  Hold diuretics, keep potassium 40 meq/day.  On Friday she can resume lasix 80mg  once daily.     Let's do labs again in a month with a telehealth or phone call with me (can be over lunch on a ATC STJ day for me) and see her back as planned in March.     Thanks    ----- Message -----  From: Katina Degree, RN  Sent: 09/10/2019   4:58 PM CST  To: Jamison Neighbor, MD    Lab results for your review and recommendations. Patient's creatinine is elevated to 3.29 from 2.51 from 8 days ago.

## 2019-09-23 ENCOUNTER — Encounter: Admit: 2019-09-23 | Discharge: 2019-09-23 | Payer: MEDICARE | Primary: Family

## 2019-09-23 DIAGNOSIS — K5792 Diverticulitis of intestine, part unspecified, without perforation or abscess without bleeding: Secondary | ICD-10-CM

## 2019-09-23 DIAGNOSIS — F319 Bipolar disorder, unspecified: Secondary | ICD-10-CM

## 2019-09-23 DIAGNOSIS — F329 Major depressive disorder, single episode, unspecified: Secondary | ICD-10-CM

## 2019-09-23 DIAGNOSIS — I701 Atherosclerosis of renal artery: Secondary | ICD-10-CM

## 2019-09-23 DIAGNOSIS — R918 Other nonspecific abnormal finding of lung field: Secondary | ICD-10-CM

## 2019-09-23 DIAGNOSIS — E785 Hyperlipidemia, unspecified: Secondary | ICD-10-CM

## 2019-09-23 DIAGNOSIS — I10 Essential (primary) hypertension: Secondary | ICD-10-CM

## 2019-09-23 DIAGNOSIS — T50905A Adverse effect of unspecified drugs, medicaments and biological substances, initial encounter: Secondary | ICD-10-CM

## 2019-09-23 DIAGNOSIS — G35 Multiple sclerosis: Secondary | ICD-10-CM

## 2019-09-23 DIAGNOSIS — N390 Urinary tract infection, site not specified: Secondary | ICD-10-CM

## 2019-10-08 ENCOUNTER — Encounter: Admit: 2019-10-08 | Discharge: 2019-10-08 | Payer: MEDICARE | Primary: Family

## 2019-10-10 ENCOUNTER — Encounter: Admit: 2019-10-10 | Discharge: 2019-10-10 | Payer: MEDICARE | Primary: Family

## 2019-10-10 DIAGNOSIS — I5032 Chronic diastolic (congestive) heart failure: Secondary | ICD-10-CM

## 2019-10-10 DIAGNOSIS — N184 Chronic kidney disease, stage 4 (severe): Secondary | ICD-10-CM

## 2019-10-10 DIAGNOSIS — I10 Essential (primary) hypertension: Secondary | ICD-10-CM

## 2019-10-10 DIAGNOSIS — I255 Ischemic cardiomyopathy: Secondary | ICD-10-CM

## 2019-10-10 DIAGNOSIS — E875 Hyperkalemia: Secondary | ICD-10-CM

## 2019-10-10 LAB — BASIC METABOLIC PANEL
Lab: 110 — ABNORMAL HIGH (ref 70–105)
Lab: 15 — ABNORMAL HIGH (ref 0–14)
Lab: 2.3 — ABNORMAL HIGH (ref 0.57–1.11)
Lab: 23
Lab: 31 — ABNORMAL HIGH (ref 7.0–18.7)
Lab: 8.8
Lab: 106
Lab: 140

## 2019-10-10 LAB — MAGNESIUM: Lab: 2.1

## 2019-10-10 LAB — BNP (B-TYPE NATRIURETIC PEPTI): Lab: 250 — ABNORMAL HIGH (ref 0–100)

## 2019-10-11 ENCOUNTER — Encounter: Admit: 2019-10-11 | Discharge: 2019-10-11 | Payer: MEDICARE | Primary: Family

## 2019-10-11 DIAGNOSIS — D62 Acute posthemorrhagic anemia: Secondary | ICD-10-CM

## 2019-10-11 DIAGNOSIS — I10 Essential (primary) hypertension: Secondary | ICD-10-CM

## 2019-10-11 DIAGNOSIS — I255 Ischemic cardiomyopathy: Secondary | ICD-10-CM

## 2019-10-11 DIAGNOSIS — I5032 Chronic diastolic (congestive) heart failure: Secondary | ICD-10-CM

## 2019-10-29 ENCOUNTER — Encounter: Admit: 2019-10-29 | Discharge: 2019-10-29 | Payer: MEDICARE | Primary: Family

## 2019-10-30 ENCOUNTER — Encounter: Admit: 2019-10-30 | Discharge: 2019-10-30 | Payer: MEDICARE | Primary: Family

## 2019-10-31 ENCOUNTER — Encounter: Admit: 2019-10-31 | Discharge: 2019-10-31 | Payer: MEDICARE | Primary: Family

## 2019-10-31 DIAGNOSIS — I701 Atherosclerosis of renal artery: Secondary | ICD-10-CM

## 2019-10-31 DIAGNOSIS — N390 Urinary tract infection, site not specified: Secondary | ICD-10-CM

## 2019-10-31 DIAGNOSIS — E785 Hyperlipidemia, unspecified: Secondary | ICD-10-CM

## 2019-10-31 DIAGNOSIS — I10 Essential (primary) hypertension: Secondary | ICD-10-CM

## 2019-10-31 DIAGNOSIS — F319 Bipolar disorder, unspecified: Secondary | ICD-10-CM

## 2019-10-31 DIAGNOSIS — D62 Acute posthemorrhagic anemia: Secondary | ICD-10-CM

## 2019-10-31 DIAGNOSIS — I5032 Chronic diastolic (congestive) heart failure: Secondary | ICD-10-CM

## 2019-10-31 DIAGNOSIS — T50905A Adverse effect of unspecified drugs, medicaments and biological substances, initial encounter: Secondary | ICD-10-CM

## 2019-10-31 DIAGNOSIS — N184 Chronic kidney disease, stage 4 (severe): Secondary | ICD-10-CM

## 2019-10-31 DIAGNOSIS — G35 Multiple sclerosis: Secondary | ICD-10-CM

## 2019-10-31 DIAGNOSIS — I255 Ischemic cardiomyopathy: Secondary | ICD-10-CM

## 2019-10-31 DIAGNOSIS — F329 Major depressive disorder, single episode, unspecified: Secondary | ICD-10-CM

## 2019-10-31 DIAGNOSIS — K5792 Diverticulitis of intestine, part unspecified, without perforation or abscess without bleeding: Secondary | ICD-10-CM

## 2019-10-31 DIAGNOSIS — R918 Other nonspecific abnormal finding of lung field: Secondary | ICD-10-CM

## 2019-10-31 LAB — BASIC METABOLIC PANEL
Lab: 105
Lab: 142
Lab: 2.4 — ABNORMAL HIGH (ref 0.57–1.11)
Lab: 25
Lab: 36 — ABNORMAL HIGH (ref 7.0–18.7)
Lab: 4
Lab: 9.7
Lab: 96

## 2019-10-31 LAB — IRON + BINDING CAPACITY + %SAT+ FERRITIN
Lab: 19 mg/dL — ABNORMAL LOW (ref 20–55)
Lab: 258 g/dL (ref 3.5–5.0)
Lab: 317 g/dL (ref 6.0–8.0)
Lab: 56 mg/dL — ABNORMAL LOW (ref 0.3–1.2)
Lab: 59 mg/dL (ref 0.4–1.00)

## 2019-10-31 LAB — FERRITIN: Lab: 56 10*3/uL (ref 0–0.20)

## 2019-10-31 LAB — MAGNESIUM: Lab: 2.3

## 2019-10-31 LAB — BNP (B-TYPE NATRIURETIC PEPTI): Lab: 209 — ABNORMAL HIGH (ref 0–100)

## 2019-10-31 MED ORDER — ISOSORBIDE MONONITRATE 60 MG PO TB24
60 mg | ORAL_TABLET | Freq: Every morning | ORAL | 3 refills | 30.00000 days | Status: AC
Start: 2019-10-31 — End: ?

## 2019-11-01 ENCOUNTER — Encounter: Admit: 2019-11-01 | Discharge: 2019-11-01 | Payer: MEDICARE | Primary: Family

## 2019-11-01 MED ORDER — FERRIC CARBOXYMALTOSE IVPB
750 mg | Freq: Once | INTRAVENOUS | 1 refills | Status: DC
Start: 2019-11-01 — End: 2019-11-01

## 2019-11-01 MED ORDER — FERRIC CARBOXYMALTOSE IVPB
750 mg | Freq: Once | INTRAVENOUS | 1 refills | Status: AC
Start: 2019-11-01 — End: ?

## 2019-11-01 NOTE — Telephone Encounter
Routed to [place] scheduling.  Patient notified.

## 2019-11-01 NOTE — Telephone Encounter
-----   Message from Angelyn Punt, RN sent at 11/01/2019  8:35 AM CDT -----    ----- Message -----  From: Jamison Neighbor, MD  Sent: 11/01/2019   8:27 AM CDT  To: Cvm Nurse [facility] [place]    She is iron deficient.  Please refer for iv iron infusion, tell her it may help with fatigue, and injectafer 750mg  iv x 2 doses, 7 days apart.    ----- Message -----  From: Katina Degree, RN  Sent: 10/31/2019   4:47 PM CDT  To: Jamison Neighbor, MD    Lab results for your review and recommendations.

## 2019-11-04 ENCOUNTER — Encounter: Admit: 2019-11-04 | Discharge: 2019-11-04 | Payer: MEDICARE | Primary: Family

## 2019-11-04 DIAGNOSIS — E785 Hyperlipidemia, unspecified: Secondary | ICD-10-CM

## 2019-11-04 DIAGNOSIS — F319 Bipolar disorder, unspecified: Secondary | ICD-10-CM

## 2019-11-04 DIAGNOSIS — K5792 Diverticulitis of intestine, part unspecified, without perforation or abscess without bleeding: Secondary | ICD-10-CM

## 2019-11-04 DIAGNOSIS — R918 Other nonspecific abnormal finding of lung field: Secondary | ICD-10-CM

## 2019-11-04 DIAGNOSIS — F329 Major depressive disorder, single episode, unspecified: Secondary | ICD-10-CM

## 2019-11-04 DIAGNOSIS — I10 Essential (primary) hypertension: Secondary | ICD-10-CM

## 2019-11-04 DIAGNOSIS — I701 Atherosclerosis of renal artery: Secondary | ICD-10-CM

## 2019-11-04 DIAGNOSIS — G35 Multiple sclerosis: Secondary | ICD-10-CM

## 2019-11-04 DIAGNOSIS — T50905A Adverse effect of unspecified drugs, medicaments and biological substances, initial encounter: Secondary | ICD-10-CM

## 2019-11-04 DIAGNOSIS — N390 Urinary tract infection, site not specified: Secondary | ICD-10-CM

## 2019-11-17 ENCOUNTER — Encounter: Admit: 2019-11-17 | Discharge: 2019-11-17 | Payer: MEDICARE | Primary: Family

## 2019-11-17 DIAGNOSIS — I10 Essential (primary) hypertension: Secondary | ICD-10-CM

## 2019-11-17 DIAGNOSIS — T50905A Adverse effect of unspecified drugs, medicaments and biological substances, initial encounter: Secondary | ICD-10-CM

## 2019-11-17 DIAGNOSIS — K5792 Diverticulitis of intestine, part unspecified, without perforation or abscess without bleeding: Secondary | ICD-10-CM

## 2019-11-17 DIAGNOSIS — N390 Urinary tract infection, site not specified: Secondary | ICD-10-CM

## 2019-11-17 DIAGNOSIS — E785 Hyperlipidemia, unspecified: Secondary | ICD-10-CM

## 2019-11-17 DIAGNOSIS — G35 Multiple sclerosis: Secondary | ICD-10-CM

## 2019-11-17 DIAGNOSIS — R918 Other nonspecific abnormal finding of lung field: Secondary | ICD-10-CM

## 2019-11-17 DIAGNOSIS — F319 Bipolar disorder, unspecified: Secondary | ICD-10-CM

## 2019-11-17 DIAGNOSIS — I701 Atherosclerosis of renal artery: Secondary | ICD-10-CM

## 2019-11-17 DIAGNOSIS — F329 Major depressive disorder, single episode, unspecified: Secondary | ICD-10-CM

## 2019-11-29 ENCOUNTER — Encounter: Admit: 2019-11-29 | Discharge: 2019-11-29 | Payer: MEDICARE | Primary: Family

## 2019-11-29 MED ORDER — ISOSORBIDE MONONITRATE 30 MG PO TB24
30 mg | ORAL_TABLET | Freq: Every morning | ORAL | 3 refills
Start: 2019-11-29 — End: ?

## 2020-02-17 ENCOUNTER — Encounter: Admit: 2020-02-17 | Discharge: 2020-02-17 | Payer: MEDICARE | Primary: Family

## 2020-02-18 MED ORDER — CARVEDILOL 25 MG PO TAB
ORAL_TABLET | Freq: Two times a day (BID) | ORAL | 3 refills | 30.00000 days | Status: AC
Start: 2020-02-18 — End: ?

## 2020-03-05 ENCOUNTER — Encounter: Admit: 2020-03-05 | Discharge: 2020-03-05 | Payer: MEDICARE | Primary: Family

## 2020-03-05 NOTE — Progress Notes
Patient of Dr. Pierre Bali, presented today with numbness and tingling, chest pressure.  EKG without changes from previous.Troponin < 0.10, Chronic elevated Cr 2.98 baseline 2.7.  Would like to speak with Cardiologist before discharging patient just to make sure not missing something and is ok to discharge.

## 2020-03-11 ENCOUNTER — Encounter: Admit: 2020-03-11 | Discharge: 2020-03-11 | Payer: MEDICARE | Primary: Family

## 2020-03-11 DIAGNOSIS — N184 Chronic kidney disease, stage 4 (severe): Secondary | ICD-10-CM

## 2020-03-11 DIAGNOSIS — G35 Multiple sclerosis: Secondary | ICD-10-CM

## 2020-03-11 DIAGNOSIS — D684 Acquired coagulation factor deficiency: Secondary | ICD-10-CM

## 2020-03-11 DIAGNOSIS — I255 Ischemic cardiomyopathy: Secondary | ICD-10-CM

## 2020-03-11 DIAGNOSIS — E785 Hyperlipidemia, unspecified: Secondary | ICD-10-CM

## 2020-03-11 DIAGNOSIS — T50905A Adverse effect of unspecified drugs, medicaments and biological substances, initial encounter: Secondary | ICD-10-CM

## 2020-03-11 DIAGNOSIS — R918 Other nonspecific abnormal finding of lung field: Secondary | ICD-10-CM

## 2020-03-11 DIAGNOSIS — F319 Bipolar disorder, unspecified: Secondary | ICD-10-CM

## 2020-03-11 DIAGNOSIS — F329 Major depressive disorder, single episode, unspecified: Secondary | ICD-10-CM

## 2020-03-11 DIAGNOSIS — I701 Atherosclerosis of renal artery: Secondary | ICD-10-CM

## 2020-03-11 DIAGNOSIS — N390 Urinary tract infection, site not specified: Secondary | ICD-10-CM

## 2020-03-11 DIAGNOSIS — I10 Essential (primary) hypertension: Secondary | ICD-10-CM

## 2020-03-11 DIAGNOSIS — I25118 Atherosclerotic heart disease of native coronary artery with other forms of angina pectoris: Secondary | ICD-10-CM

## 2020-03-11 DIAGNOSIS — K5792 Diverticulitis of intestine, part unspecified, without perforation or abscess without bleeding: Secondary | ICD-10-CM

## 2020-03-11 DIAGNOSIS — I5032 Chronic diastolic (congestive) heart failure: Secondary | ICD-10-CM

## 2020-03-11 MED ORDER — TORSEMIDE 20 MG PO TAB
40 mg | ORAL_TABLET | Freq: Two times a day (BID) | ORAL | 3 refills | 30.00000 days | Status: AC
Start: 2020-03-11 — End: ?

## 2020-03-11 MED ORDER — POTASSIUM CHLORIDE 10 MEQ PO CPER
30 meq | ORAL_CAPSULE | ORAL | 3 refills | 30.00000 days | Status: AC
Start: 2020-03-11 — End: ?

## 2020-03-11 NOTE — Progress Notes
Date of Service: 03/11/2020    Debra Hall is a 49 y.o. female.       HPI     Ms. Lamaria Uden was seen in our office today for post hospital follow-up of her heart failure and CAD.  The patient is a 49 year old female followed in our office by Dr. Thressa Sheller of our advanced heart failure team.  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 Echo performed on 05/22/2019 showed ejection fraction 65 to 70% with moderate diastolic dysfunction and elevated with elevated filling pressure, PA  systolic pressure ~ 40 mmHg, normal right ventricular systolic function.. Her most recent regadenoson MPI stress test was 08/15/2019 with a sum stress score 0, summed rest score 0, EF 72%, and no evidence of ischemia.  Overall this was a low risk study.    The patient was last seen in our office on 10/31/2019 by Dr. Pierre Bali. She was continued on same dose of Lasix twice daily, however advised that if she gets sick, dehydrated or if her weight drops below 145 pounds she should decrease the dose of her diuretic down to once daily dosing.  Laboratories were checked and she was found to be iron deficient anemic.  She then received IV iron with Injectafer 750 mg x 2 doses, 7 days apart.    She presented to the emergency room at [facility] on 03/05/2020 with chest pressure and numbness in legs from waist to feet.  Records reviewed at the time of the patient's visit today.  EKG without changes from previous EKG from 12/26/2019.  There were no significant ST depressions or elevations.  Chest x-ray showed no acute infiltrates, large lesion in right lung base is re? demonstrated, prior  CT chest report from 10/15/2018.  Her creatinine was elevated from baseline.  Troponin negative.  Chronic elevated creatinine of 2.9, baseline 2.7.  Sodium 141, potassium 3.6.  Cardiology was consulted by phone.  Nothing additional was ordered.  She was admitted and discharged on 03/09/20.  It was thought that the patient's symptoms were related to flare of her MS. During admission she was given IV steroids and transitioned to oral Medrol 4 mg Dosepak at discharge. Patient was advised to follow-up in our office and may need stress test.    Today the patient reports about a 10 pound weight gain in the past 2 weeks.  She tells me her weight started going up about a month ago.  She is on prednisone which was started during her recent hospital admission.  She notes minimal lower extremity edema which sometimes worsens throughout the day when she is up doing activity.  She also tells me that sometimes her hands swell.  She does complain of exertional dyspnea which is at her usual baseline and not getting worse.  She denies chest pain, PND and orthopnea.  She does endorse abdominal bloating and feels like her clothes are fitting tighter.  He does have history of nonexertional chest pain when her blood pressure is high that responds to nitroglycerin.  She notes occasional dizziness but has had no near syncope or syncopal episodes.  She denies palpitations and racing heartbeats.  She reports ongoing fatigue.  She tells me she is trying to be more active and  is walking more with use of a walker, she is washing her own bedding frequently due to her urinary incontinence and she has been doing as much housecleaning as she can.  She is also in some upper body exercises to help with strengthening.  She is very concerned about her incontinence and tells me that sometimes she goes to a pack of disposable bed pads in a day.  Follows with Dr. Larwance Rote, neurology in [place], [place].  She is also following with nephrology at [facility].  She tells me that her last visit there was discussion the patient may need dialysis soon or consider renal transplant.  She is weighing herself daily and reports home scale weights between 161- 162 pounds, trending up recently.  At home she is using a old scale that is not digital.  Today she weighs 171 pounds which is up 12 pounds since her last visit in our [place] [facility] in March.  Her blood pressure is 128/80, pulse 91 bpm.  She had an  initial visit with home health, PT and OT yesterday.  She tells me that Occupational Therapy is working with her to help find perhaps some assisted living to provide her with more care and safety options.  Current diuretic is Lasix 80 mg twice daily.  She is on potassium 20 mill equivalents twice daily.  She tells me that she has been eating more over the past several months and that she is always thirsty and it is difficult to maintain a fluid restriction.    Assessment and Plan:  1.  Chronic systolic and diastolic heart failure with recovered ejection fraction. NYHA functional class III, stage C  symptoms. She has stable angina responsive to nitroglycerin.  Chest pain symptoms are currently controlled and she is not having any accelerating angina symptoms.  Her angina often flares up when she is not doing well with her multiple sclerosis and fatigue.  Her weight is up 12 pounds since her last visit in March of this year.  Patient recalls that her weight was 162 pounds during her recent hospital admission, but I do not have written record of that.  Her weight today is 171 pounds.  Suspect that her weight gain is multifactorial due to increased volume, steroid use and calorie weight gain.  I am going to switch her furosemide to torsemide and continue to monitor.  2.  Ischemic cardiomyopathy  3.  Premature coronary artery disease post CABG. we will continue aspirin, statin and beta-blocker.  She is not reporting angina symptoms today and tells me she has had no recurrence of symptoms since hospital discharge.  Her last MPI stress scan in December 2020 showed nonischemic and considered low risk.  I am going to hold off ordering the stress test and will review the patient with Dr. Pierre Bali for her recommendations.  4.  Hypertension.  Controlled on current therapy.  5.  CKD stage IV.  Last creatinine 2.9 on 03/05/2020.  Baseline creatinine 2.7.  Followed by nephrology at [facility] in [place], [place]  6.  Iron deficiency anemia.   7.  Incontinence.  Followed by urology at [facility] in [place], [place].  8.  Multiple sclerosis.    -Stop furosemide  ?Start torsemide 40 mg twice daily  ?Increase potassium from 20 mEq twice daily to 30 mEq twice daily  -If dehydrated or weight drops below 145 pounds, instructed to decrease torsemid 40 mg to once daily.  -Check BMP and BNP in 1 week on 03/16/2020.  -  Daily weights.  Asked patient to obtain a digital scale which she agrees to do.  ?Instructed to contact our office if she has weight gain of 3 pounds overnight or 5 pounds in 1 week or has increasing shortness of breath, lower extremity edema, chest pain, palpitations or dizziness.  ?2 g sodium restricted diet.  ?2 L fluid restriction.  Asked patient to work hard on limiting her fluids to 2 L / 64 ounces a day.  -Advised to follow-up with urology as scheduled.  ?Advised to schedule follow-up appointment with nephrology.    Follow-up: Follow-up with Dr. Pierre Bali as scheduled on 04/21/2020.  Patient is encouraged to contact our office if she has problems prior to next visit.    I have educated the patient/family on the plan of care today.  Patient verbalized understanding and agreement with the plan.  Instructions are outlined in the after visit summary document     Thank you for the opportunity to persuade in the care of this pleasant patient.  Please do not hesitate to contact me with any questions or concerns.     DISEASE PREVENTION:  Lipids/statin treatment: On treatment with atorvastatin 40 mg daily.  Lipid profile 01/16/2019: Total cholesterol 95, triglycerides 70, HDL 36, LDL 43 Goal LDL< 70, triglycerides < 135.      Hypertension:   Blood pressure today 128/80.  Goal systolic less than 135, diastolic less than 90.     Diabetes: No.      Tobacco: Denies use.     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  Discussed management through diet, weight loss and exercise.     I spent 60 minutes on this encounter including time for chart review and documentation.  Total counseling time with the patient today was greater than 35 minutes discussing her heart disease, reviewed medications, patient interactions, diet/sodium restriction, fluid restriction, daily weights, s/s of fluid overload, follow up plan.  We reviewed the above treatment plan, went over risks/benefits/alternatives to the therapies, and all questions were answered to her satisfaction.  Reviewed hospital records at the time of the patient's visit today           DRB  Vitals:    03/11/20 1510   BP: 128/80   BP Source: Arm, Left Upper   Patient Position: Sitting   Pulse: 91   SpO2: 98%   Weight: 77.6 kg (171 lb)   Height: 1.702 m (5' 7)   PainSc: Six     Body mass index is 26.78 kg/m?Marland Kitchen     Past Medical History  Patient Active Problem List    Diagnosis Date Noted   ? Coronary artery disease of native artery of native heart with stable angina pectoris (HCC) 09/23/2019   ? Chronic heart failure with preserved ejection fraction (HFpEF) (HCC) 09/23/2019   ? Ischemic cardiomyopathy 08/12/2018   ? Volume overload 06/29/2018   ? 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 Acute blood loss anemia 06/12/2018   ? Hyperkalemia 06/12/2018   ? 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 CAD (coronary artery disease), native coronary artery 06/07/2018     06/04/18: Admitted with type 2 NSTEMI due to hypertensive emergency    06/05/18: Cath - 20-30% left main, 40-50% prox-LAD, 70% mid-LAD, 70% mid-to-distal LAD, 80% ostial 1st diag, 80% prox-OM1, 70% OM3, 30-40% prox-to-mid CFX, 70% DISTAL CFX, subtotal occlusion of mid -RCA - CTS consult     ? Type 2 myocardial infarction (HCC) 06/05/2018   ? CKD (chronic kidney  disease) stage 4, GFR 15-29 ml/min (HCC) 05/29/2018   ? 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 Urinary tract infection 08/12/2015   ? Altered mental status 07/05/2010   ? Bipolar affective disorder (HCC) 07/05/2010   ? Essential hypertension 05/07/2009   ? 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 Multiple sclerosis (HCC) 05/07/2009   ? Frequent UTI 05/07/2009   ? Depression 05/07/2009   ? Unstable angina (HCC) 05/07/2009         Review of Systems   Constitution: Negative.   HENT: Negative.    Eyes: Negative.    Cardiovascular: Negative.    Respiratory: Negative.    Endocrine: Negative.    Hematologic/Lymphatic: Negative.    Skin: Negative.    Musculoskeletal: Negative.    Gastrointestinal: Negative.    Genitourinary: Negative.    Neurological: Positive for numbness.   Psychiatric/Behavioral: Negative.    Allergic/Immunologic: Negative.        Physical Exam  Vital signs were reviewed.   General Appearance:appears well nourished, appears relaxed, in no acute distress,wearing a face mask  Skin: warm, moist, intact, no rash or lesions, no xanthomas, erythema  HEENT: unremarkable, pupils equal and round, no scleral icterus, conjunctivae and lids normal  Lips & Mouth: no pallor or cyanosis  Neck Veins:  JVP 9 to 10 cm, neck veins are mildly distended, could not appreciate much HJR.  Carotid Arteries: normal carotid upstroke bilaterally, no bruits bilaterally  Chest Inspection: chest is normal in appearance  Auscultation/Percussion/Effort: lungs clear to auscultation, no rales, rhonchi, or wheezing, respirations even and unlabored, no respiratory distress  Cardiac Rhythm: regular rhythm and normal rate   Cardiac Auscultation: normal S1 & S2, no S3 or S4, no rub   Murmurs: no cardiac murmurs   Extremities: trace lower extremity edema bilaterally, 2+ symmetric distal pulses   Abdominal Exam: soft, non-tender, + distended, no obvious masses, bowel sounds normal, no guarding  Liver & Spleen: no organomegaly   Neurologic Exam: grossly intact, alert, moves all extremities equally   Orientation: oriented to time, person and place, clear historian  Gait: normal, steady, walks without assistance  Language & Memory: speech clear, patient responsive, seems to comprehend information  Psychiatric: normal mood and affect; judgment, behavior, and thought content normal.                   Problems Addressed Today  Encounter Diagnoses   Name Primary?   ? Coronary artery disease of native artery of native heart with stable angina pectoris (HCC) Yes   ? Chronic heart failure with preserved ejection fraction (HFpEF) (HCC)    ? Essential hypertension    ? Ischemic cardiomyopathy    ? Acquired coagulation factor deficiency (HCC)    ? CKD (chronic kidney disease) stage 4, GFR 15-29 ml/min (HCC)                      Current Medications (including today's revisions)  ? acetaminophen (TYLENOL) 325 mg tablet Take two tablets by mouth every 6 hours as needed.   ? amLODIPine (NORVASC) 10 mg tablet Take one tablet by mouth daily.   ? aspirin EC 81 mg tablet Take one tablet by mouth daily. Take with food.   ? atorvastatin (LIPITOR) 40 mg tablet Take one tablet by mouth at bedtime daily.   ? carvediloL (COREG) 25 mg tablet TAKE ONE TABLET BY MOUTH TWICE DAILY WITH FOOD   ? cetirizine (ZYRTEC) 10 mg tablet Take  10 mg by mouth as Needed for Allergy symptoms.   ? clopiDOGrel (PLAVIX) 75 mg tablet Take 75 mg by mouth daily.   ? cyclobenzaprine (FLEXERIL) 10 mg PO tablet Take 10 mg by mouth Three Times Daily  as needed for Muscle Cramps.   ? diphenhydrAMINE (BENADRYL) 25 mg capsule Take 25 mg by mouth as Needed.   ? docusate (COLACE) 100 mg capsule Take 100 mg by mouth as Needed.   ? duloxetine DR (CYMBALTA) 20 mg capsule Take 20 mg by mouth twice daily.   ? ergocalciferol (vitamin D2) (VITAMIN D PO) Take 50,000 Units by mouth twice weekly.   ? fluticasone propionate (FLONASE) 50 mcg/actuation nasal spray Apply 2 sprays to each nostril as directed as Needed.   ? gabapentin (NEURONTIN) 300 mg capsule Take 300 mg by mouth every 8 hours.   ? hydrALAZINE (APRESOLINE) 100 mg tablet Take one tablet by mouth three times daily.   ? HYDROcodone/acetaminophen (NORCO) 5/325 mg tablet Take 1 tablet by mouth every 4 hours as needed for Pain   ? isosorbide mononitrate ER (IMDUR) 60 mg tablet Take one tablet by mouth every morning.   ? LORazepam (ATIVAN) 1 mg tablet Take 1 mg by mouth twice daily as needed.   ? methylPREDNISolone (MEDROL) 4 mg tablet Take 4 mg by mouth four times daily. Take with food.   ? multivit with calcium,iron,min [facility] DAILY MULTIVITAMIN PO) Take 1 tablet by mouth daily.   ? natalizumab (TYSABRI) 300 mg/15 mL injection Administer 300 mg through vein every 30 days.   ? nitroglycerin (NITROSTAT) 0.4 mg tablet DISSOLVE ONE TABLET UNDER TONGUE EVERY FIVE MINUTES AS NEEDED FOR CHEST PAIN. *DO NOT EXCEED A TOTAL OF THREE DOSES IN 15 MINUTES. CALL 911 IF CHEST PAIN PERSISTS AFTER THREE DOSES*   ? oxybutynin chloride (DITROPAN) 5 mg tablet Take 1 tablet by mouth three times daily.   ? pantoprazole DR (PROTONIX) 40 mg tablet Take 40 mg by mouth daily.   ? potassium chloride (MICRO-K) 10 mEq capsule Take three capsules by mouth as directed. 3 tablets twice a day with torsemide.   ? torsemide (DEMADEX) 20 mg tablet Take two tablets by mouth twice daily.

## 2020-03-18 ENCOUNTER — Encounter: Admit: 2020-03-18 | Discharge: 2020-03-18 | Payer: MEDICARE | Primary: Family

## 2020-03-18 DIAGNOSIS — N184 Chronic kidney disease, stage 4 (severe): Secondary | ICD-10-CM

## 2020-03-18 DIAGNOSIS — I25118 Atherosclerotic heart disease of native coronary artery with other forms of angina pectoris: Secondary | ICD-10-CM

## 2020-03-18 DIAGNOSIS — I5032 Chronic diastolic (congestive) heart failure: Secondary | ICD-10-CM

## 2020-04-15 ENCOUNTER — Encounter: Admit: 2020-04-15 | Discharge: 2020-04-15 | Payer: MEDICARE | Primary: Family

## 2020-06-06 ENCOUNTER — Encounter: Admit: 2020-06-06 | Discharge: 2020-06-06 | Payer: MEDICARE | Primary: Family

## 2020-06-06 MED ORDER — AMLODIPINE 10 MG PO TAB
ORAL_TABLET | Freq: Every day | 3 refills
Start: 2020-06-06 — End: ?

## 2020-08-08 IMAGING — CT CT head/brain wo con
2 series · 16 of 30 positions shown, 20 images · non-contrast
Comparison: none

[Series 2: head 5.0 · axial · 0.49mm/px · z∈[-1,+135]mm · 14 of 23 slices shown, 18 images]
[im 2/23  brain]
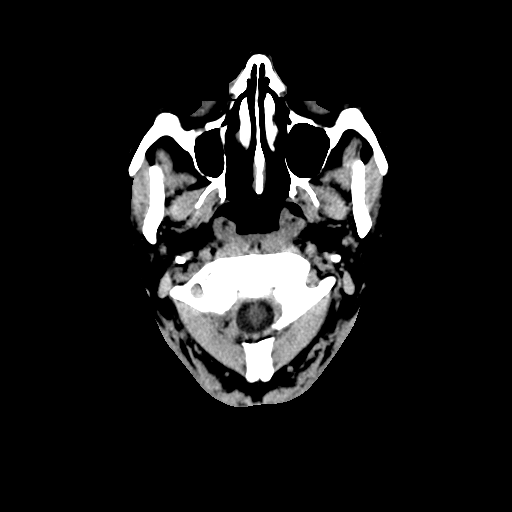
[im 2/23  bone]
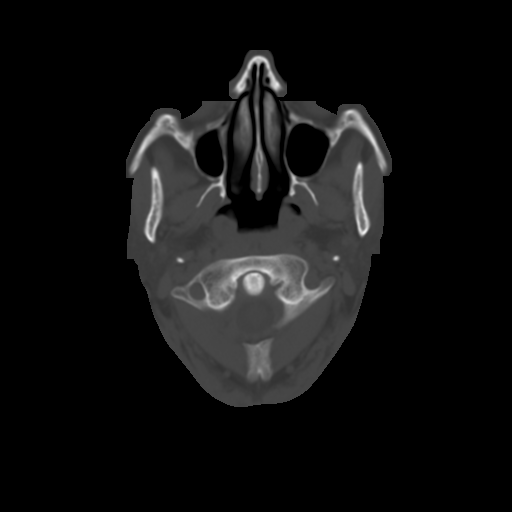
[im 3/23  brain]
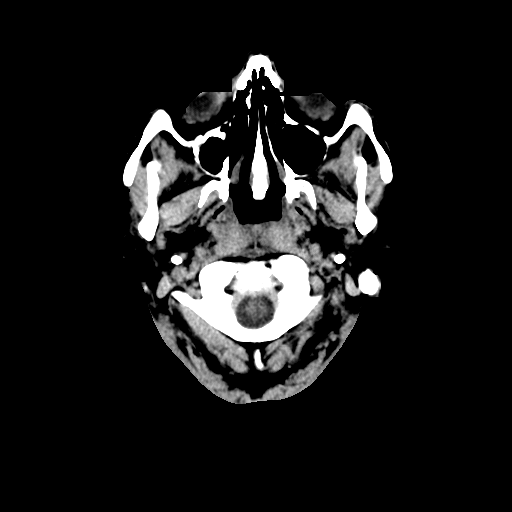
[im 5/23  brain]
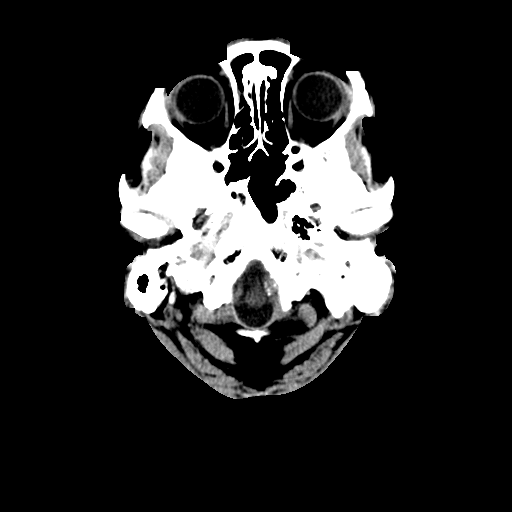
[im 6/23  brain]
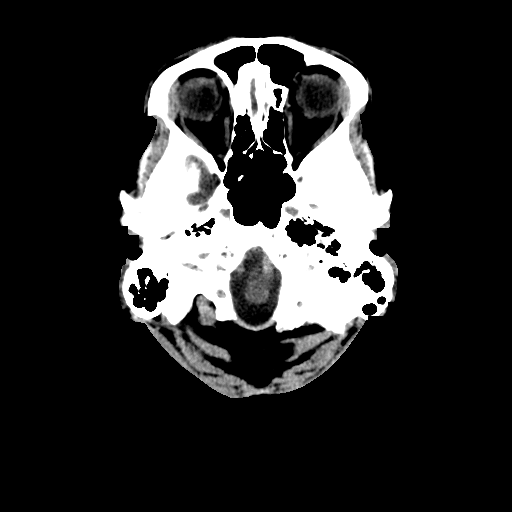
[im 8/23  brain]
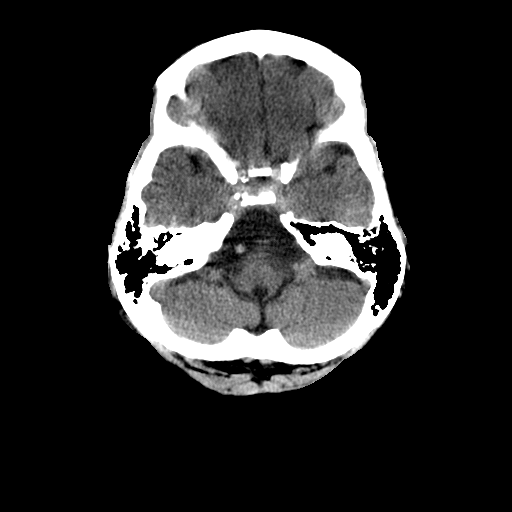
[im 8/23  bone]
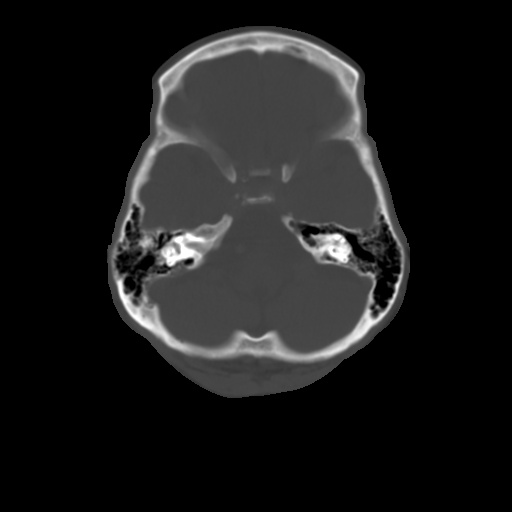
[im 9/23  brain]
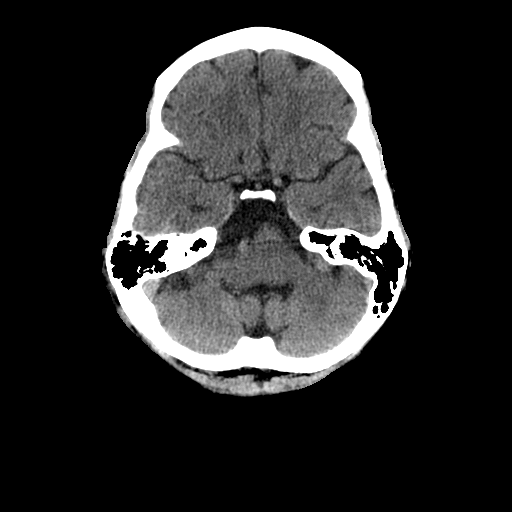
[im 11/23  brain]
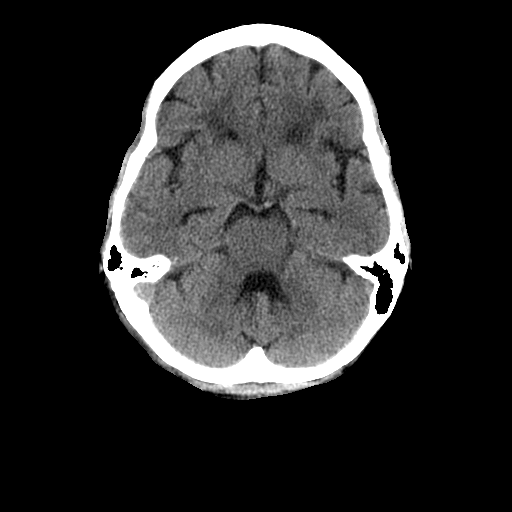
[im 12/23  brain]
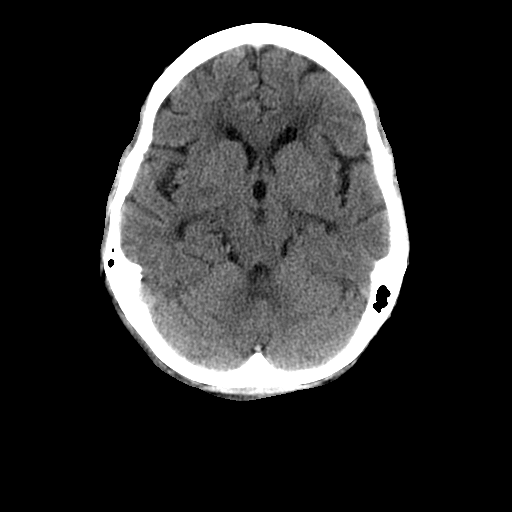
[im 14/23  brain]
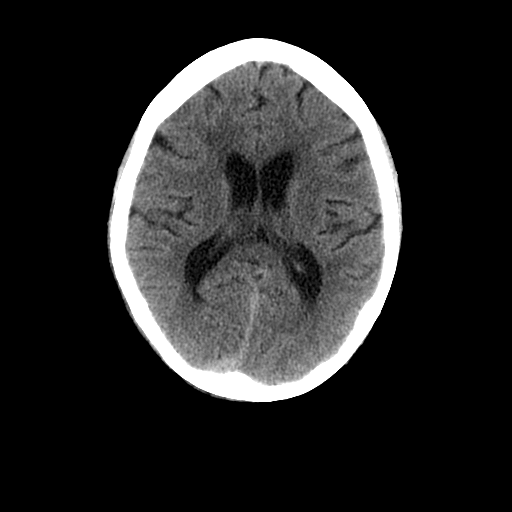
[im 14/23  bone]
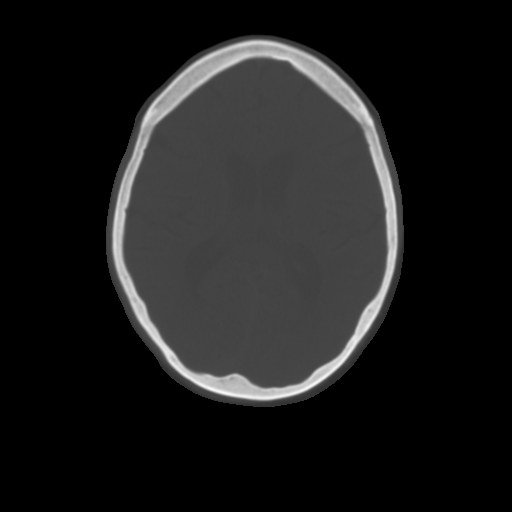
[im 15/23  brain]
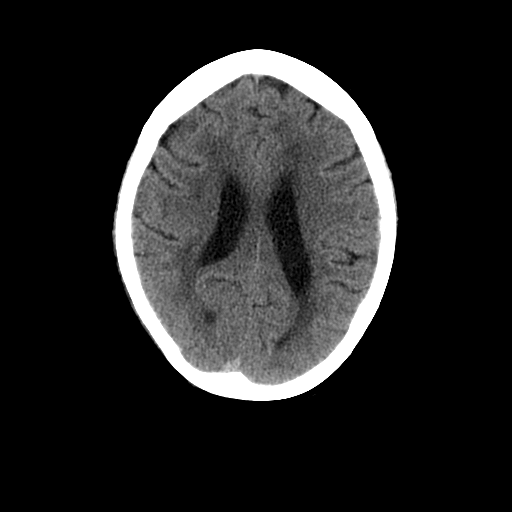
[im 17/23  brain]
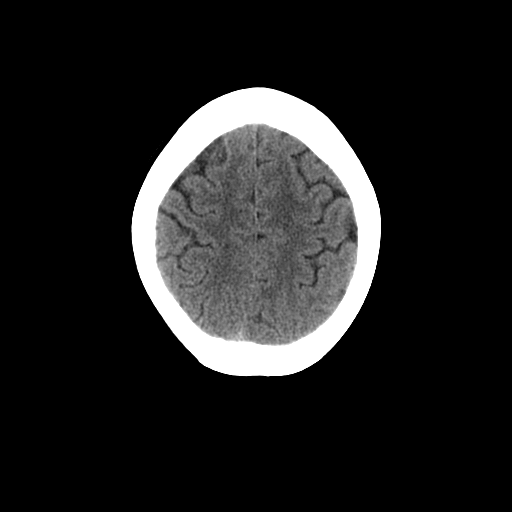
[im 18/23  brain]
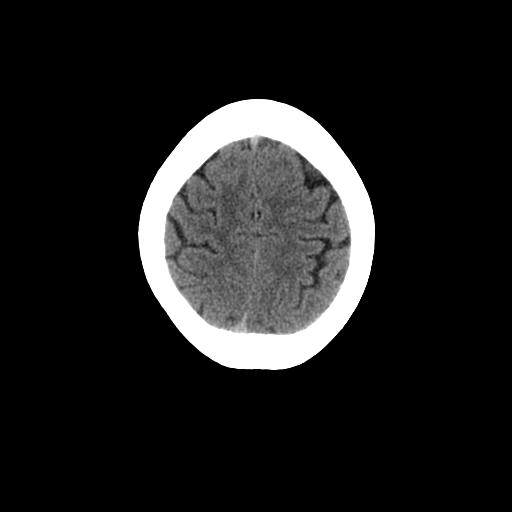
[im 20/23  brain]
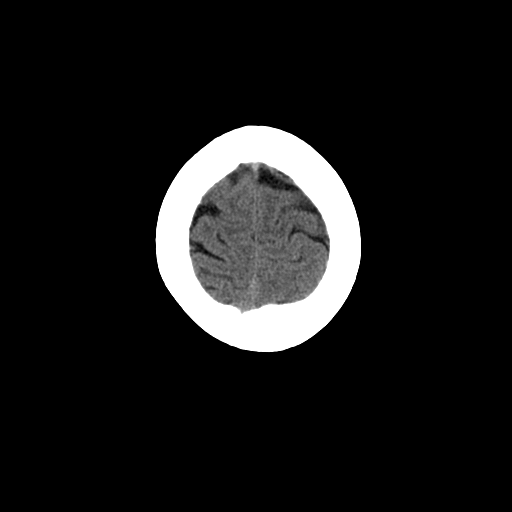
[im 20/23  bone]
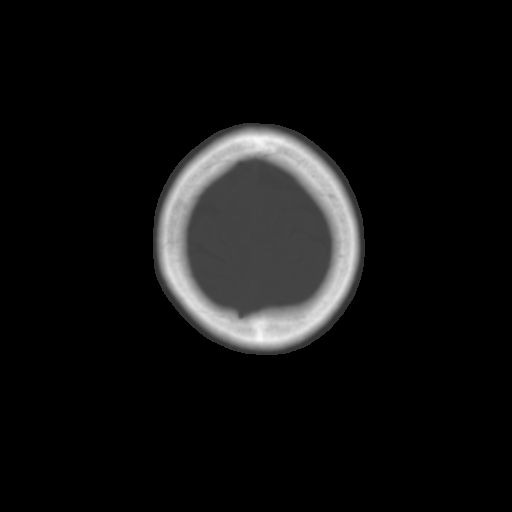
[im 21/23  brain]
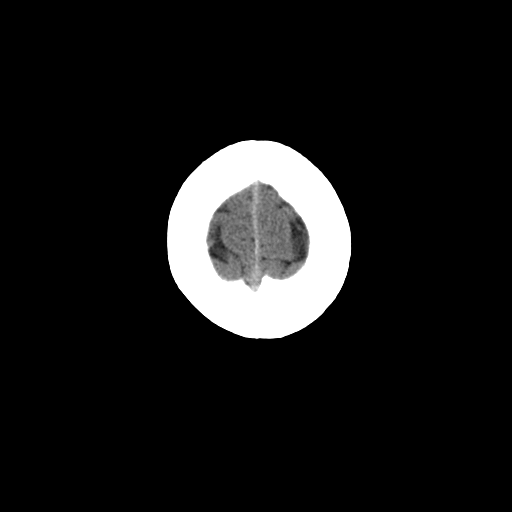

[Series 3: bone · axial · 0.49mm/px · z∈[+4,+19]mm · 2 of 21 slices shown]
[im 2/21  bone]
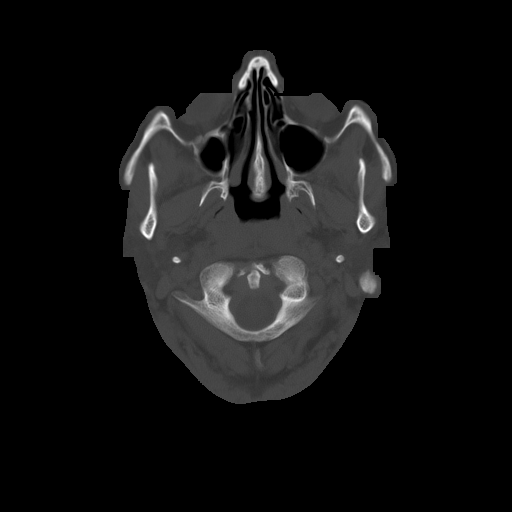
[im 5/21  bone]
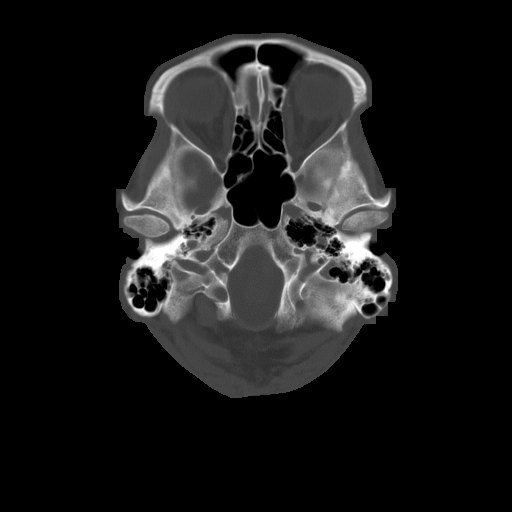

[16 of 30 positions shown; findings below may reference images not displayed]

EXAM
CT head without contast.

INDICATION
ams, htn.
ams - AK/AB

TECHNIQUE
Volumetric multi detector CT images of the head were obtained without the administration of IV
contrast.
All CT scans at this facility use dose modulation, iterative reconstruction, and/or weight based
dosing when appropriate to reduce radiation dose to as low as reasonably achievable.

COMPARISONS
[None available].

FINDINGS
There is no intra-axial or extra-axial fluid collection. There is no midline shift or mass effect.
The ventricles and sulci are normal in size and position. The parenchyma is grossly normal in
attenuation with preserved gray-white differentiation. The visualized paranasal sinuses are clear.
The mastoid air cells well aerated. The bony calvarium is intact. The orbits and their contents are
unremarkable.

IMPRESSION
1. No acute intracranial abnormality.

Tech Notes:

ams - AK/AB

## 2020-09-09 ENCOUNTER — Encounter: Admit: 2020-09-09 | Discharge: 2020-09-09 | Payer: MEDICARE | Primary: Family

## 2020-09-09 MED ORDER — HYDRALAZINE 100 MG PO TAB
100 mg | ORAL_TABLET | Freq: Three times a day (TID) | ORAL | 3 refills | 30.00000 days | Status: AC
Start: 2020-09-09 — End: ?

## 2020-09-22 ENCOUNTER — Encounter: Admit: 2020-09-22 | Discharge: 2020-09-22 | Payer: MEDICARE | Primary: Family

## 2020-09-22 NOTE — Progress Notes
Cardiology Consult Note    Name:  Debra Hall                                                       MRN:  [id number]   Admission Date:  (Not on file)  Location: Room/bed info not found                     Assessment/Plan:       Reason for Consultation/Chief Complaint: Hypertensive urgency    Assessment:   1. Hypertensive urgency  2. Coronary artery disease s/p CABG x 6  3. Heart failure with recovered ejection fraction  4. Stage IV chronic kidney disease  5. Uncontrolled hypertension  6. Renal artery stenosis  7. Debilitating multiple sclerosis  8. 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 Bipolar disorder    Impression/Recommendations:  50 year old female with history of uncontrolled hypertension.  She follows with Baldo Ash, MD of our advanced heart failure division, but does frequently miss/no-show appointments.  Debra Hall was referred to the emergency department by her primary care physician after presenting to the office and being found to have systolic blood pressures in the 200s.  In the emergency department her lab work was found to be stable.  She was admitted to the hospital and restarted on home medications.  Fortunately she has had rather significant improvement in her blood pressure after being restarted on home regimen.  She is supposed to take amlodipine 10 mg daily, carvedilol 25 mg twice daily, hydralazine 100 mg 3 times daily, isosorbide mononitrate 60 mg daily, and torsemide 40 mg twice daily.  After discussing her medications with her she admits to rather poor compliance.  She often forgets to take her medications and misses doses daily.  It sounds as though her presentation is likely related to medication nonadherence.  As her blood pressure is better controlled after restarting her regimen I think it is reasonable to let her go home this afternoon.  I will arrange follow-up with Dr. Pierre Bali in the coming weeks.  I did have a long discussion with Debra Hall regarding her medications.  It is very important that she not miss doses as she has rather advanced coronary artery disease.  She reports understanding and wants to try and do better.  I did ask her to keep her blood pressure recordings daily until seeing Dr. Pierre Bali so that adjustments can be made if needed.    Thank you for allowing [place] the opportunity to participate in the care of Debra Hall. Please do not hesitate to reach out with any additional questions or concerns.     Lissa Morales, MD  Division of Cardiovascular Medicine    Subjective:       History of Present Illness:  Debra Hall is a 50 y.o. female with past medical history significant for hypertension, renal artery stenosis, dyslipidemia,, coronary artery disease s/p CABG x6, ischemic cardiomyopathy with recovered ejection fraction, stage IV chronic kidney disease, debilitating multiple sclerosis, bipolar disorder and posterior reversible encephalopathy syndrome 2/2 hypertensive emergency in the past.  She was referred to the emergency department by her primary care physician after coming into the office and being found to have a systolic blood pressure reading in the 200s.  She was asymptomatic.  She was admitted from the emergency  department to the hospital for further evaluation and management.  Fortunately she had improvement in her blood pressure with restarting her home medications.  It sounds like she really was not taking them at home.  She tells me she frequently misses doses.    Debra Hall is not having any chest pain or shortness of breath with exertion.  No palpitations, heart racing, nausea, vomiting, orthopnea, paroxysmal nocturnal dyspnea, or lower extremity swelling.    Review of Systems:  A complete 10 point review of systems was negative except for that which was outlined in HPI.     Cardiopulmonary ROS as outlined above.    Pertinent negatives include:   General--Fever, chills, recent illness, fatigue, weakness, night sweats, weight loss, weight gain, anorexia  HEENT--HA, tremors, visual changes, photophobia, mouth sores, tooth pain, sore throat, dysphagia   GI--Abdominal pain, N/V/D/C, melena, hematochezia, hematemesis  GU--Dysuria, hematuria, painful urination, burning with urination  MSK--Back pain, muscle pain, joint pain  Skin--Skin rash, skin wounds  Endo--Heat intolerance, cold intolerance, polyuria, polydipsia, polyphagia  Heme--Easy bleeding, easy bruising  Neuro--Seizure, LOC  Psych--SI/HI, mood change, depression, anxiety, auditory hallucinations, visual hallucinations    Past Medical History:  Medical History:   Diagnosis Date   ? Adverse drug reaction    ? Bipolar disorder (HCC)    ? Depression 05/07/2009   ? Diverticulitis    ? Essential hypertension 05/07/2009   ? Frequent UTI 05/07/2009   ? Hyperlipemia    ? Hypertension 05/07/2009   ? Lung mass    ? Multiple sclerosis (HCC) 05/07/2009   ? 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 ANGIOGRAPHY RENAL ARTERY - BILATERAL WITH POSSIBLE STENT PLACEMENT Bilateral 05/22/2018    Performed by Greig Castilla, MD at [facility] CATH LAB   ? ANGIOGRAPHY CORONARY ARTERY WITH LEFT HEART CATHETERIZATION N/A 06/05/2018    Performed by Laney Pastor, MD at [facility] CATH LAB   ? PERCUTANEOUS CORONARY STENT PLACEMENT WITH ANGIOPLASTY N/A 06/05/2018    Performed by Laney Pastor, MD at [facility] CATH LAB   ? CORONARY ARTERY BYPASS WITH ARTERIAL GRAFT - 6 GRAFTS (Internal Mammary Artery and Endovascular Vein Harvest) N/A 06/11/2018    Performed by Collene Schlichter, MD at [facility] CVOR   ? ABDOMINAL EXPLORATION SURGERY      Patient reports this surgery was done for ruptured diverticuli   ? CARDIAC CATHERIZATION     ? CHOLECYSTECTOMY     ? DOPPLER ECHOCARDIOGRAPHY     ? ELECTROCARDIOGRAM     ? HX OOPHORECTOMY     ? HX TUBAL LIGATION         Family History:  Family History   Problem Relation Age of Onset   ? Coronary Artery Disease Mother    ? Heart Disease Mother    ? Heart Attack Mother 77   ? Cancer Father    ? Heart Surgery Sister 50   ? Heart Disease Sister    ? Heart Attack Sister    ? Heart Surgery Brother 90        CABG   ? Heart Disease Brother    ? Heart Attack Maternal Grandmother    ? Stroke Maternal Grandmother    ? Heart Disease Maternal Grandmother    ? Heart Disease Brother    ? Heart Surgery Brother 34       Social History:  Social History  Socioeconomic History   ? Marital status: Single     Spouse name: Not on file   ? Number of children: Not on file   ? Years of education: Not on file   ? Highest education level: Not on file   Occupational History   ? Not on file   Tobacco Use   ? Smoking status: Former Smoker     Packs/day: 0.25     Years: 24.00     Pack years: 6.00     Types: Cigarettes     Quit date: 08/15/2018     Years since quitting: 2.1   ? Smokeless tobacco: Never Used   Substance and Sexual Activity   ? Alcohol use: No   ? Drug use: No   ? Sexual activity: Not on file   Other Topics Concern   ? Not on file   Social History Narrative   ? Not on file       Allergies:   Nicardipine; Penicillins; Cephalosporins; Ciprofloxacin; Contrast dye iv, iodine containing [iodinated contrast media]; Ibuprofen; and Olmesartan      Objective:     Vital Signs (Last Filed)                Vital Signs (24-Hour Range)   @KURRVITALS1 @  @KURRVITALS3 @     Vital signs reviewed on 09/22/2020.    CURRENT WEIGHT: @WTENG @   BMI: @BMI1 @   PREVIOUS WEIGHTS:   Wt Readings from Last 25 Encounters:   03/11/20 77.6 kg (171 lb)   10/31/19 72.2 kg (159 lb 3.2 oz)   09/10/19 70.8 kg (156 lb)   09/05/19 74.4 kg (164 lb)   05/22/19 63 kg (139 lb)   05/16/19 63 kg (139 lb)   01/17/19 60.9 kg (134 lb 3.2 oz)   12/11/18 61.2 kg (135 lb)   10/18/18 64.3 kg (141 lb 12.8 oz)   08/10/18 67.3 kg (148 lb 6.4 oz)   08/10/18 67.3 kg (148 lb 6.4 oz)   07/05/18 66.7 kg (147 lb)   07/04/18 68.9 kg (152 lb)   07/03/18 68.9 kg (152 lb)   06/29/18 72.5 kg (159 lb 12.8 oz)   06/21/18 66.5 kg (146 lb 9.6 oz)   05/29/18 59.9 kg (132 lb)   05/24/18 58.2 kg (128 lb 4.8 oz)   08/15/15 57 kg (125 lb 10.6 oz)   07/27/10 75.8 kg (167 lb)   05/07/09 71.7 kg (158 lb)        Physical Exam:     General Appearance: No acute distress. Fully alert and oriented.  Skin: Warm. No ulcers or xanthomas.   HEENT: Grossly unremarkable. Lips and oral mucosa without pallor or cyanosis. Moist mucous membranes.   Neck Veins: Normal jugular venous pressure. Neck veins are not distended.  Carotid Arteries: Normal carotid upstroke bilaterally. No bruits.  Chest Inspection: Midline scar consistent with prior sternotomy .  Auscultation/Percussion: Normal respiratory effort. Lungs clear to auscultation bilaterally. No wheezes, rales, or rhonchi.    Cardiac Rhythm: Regular rhythm. Normal rate.  Cardiac Auscultation: Normal S1 & S2. No S3 or S4. No rub.  Murmurs: No cardiac murmurs.  Peripheral Circulation: Normal peripheral circulation.   Abdominal Aorta: No abdominal aortic bruit.  Extremities: Appropriately warm to touch. No lower extremity edema.  Abdominal Exam: Soft, non-tender. No masses, no organomegaly. Normal bowel sounds.  Neurologic Exam: Neurological assessment grossly intact.    Lab:  Labs reviewed on 09/22/2020.  Pertinent labs reviewed    Radiology and Other Diagnostic  Procedures Review:    Pertinent radiology reviewed

## 2020-09-23 IMAGING — CR CHEST
2 series · 2 of 2 positions shown · non-contrast
Comparison: none

[chest pa]
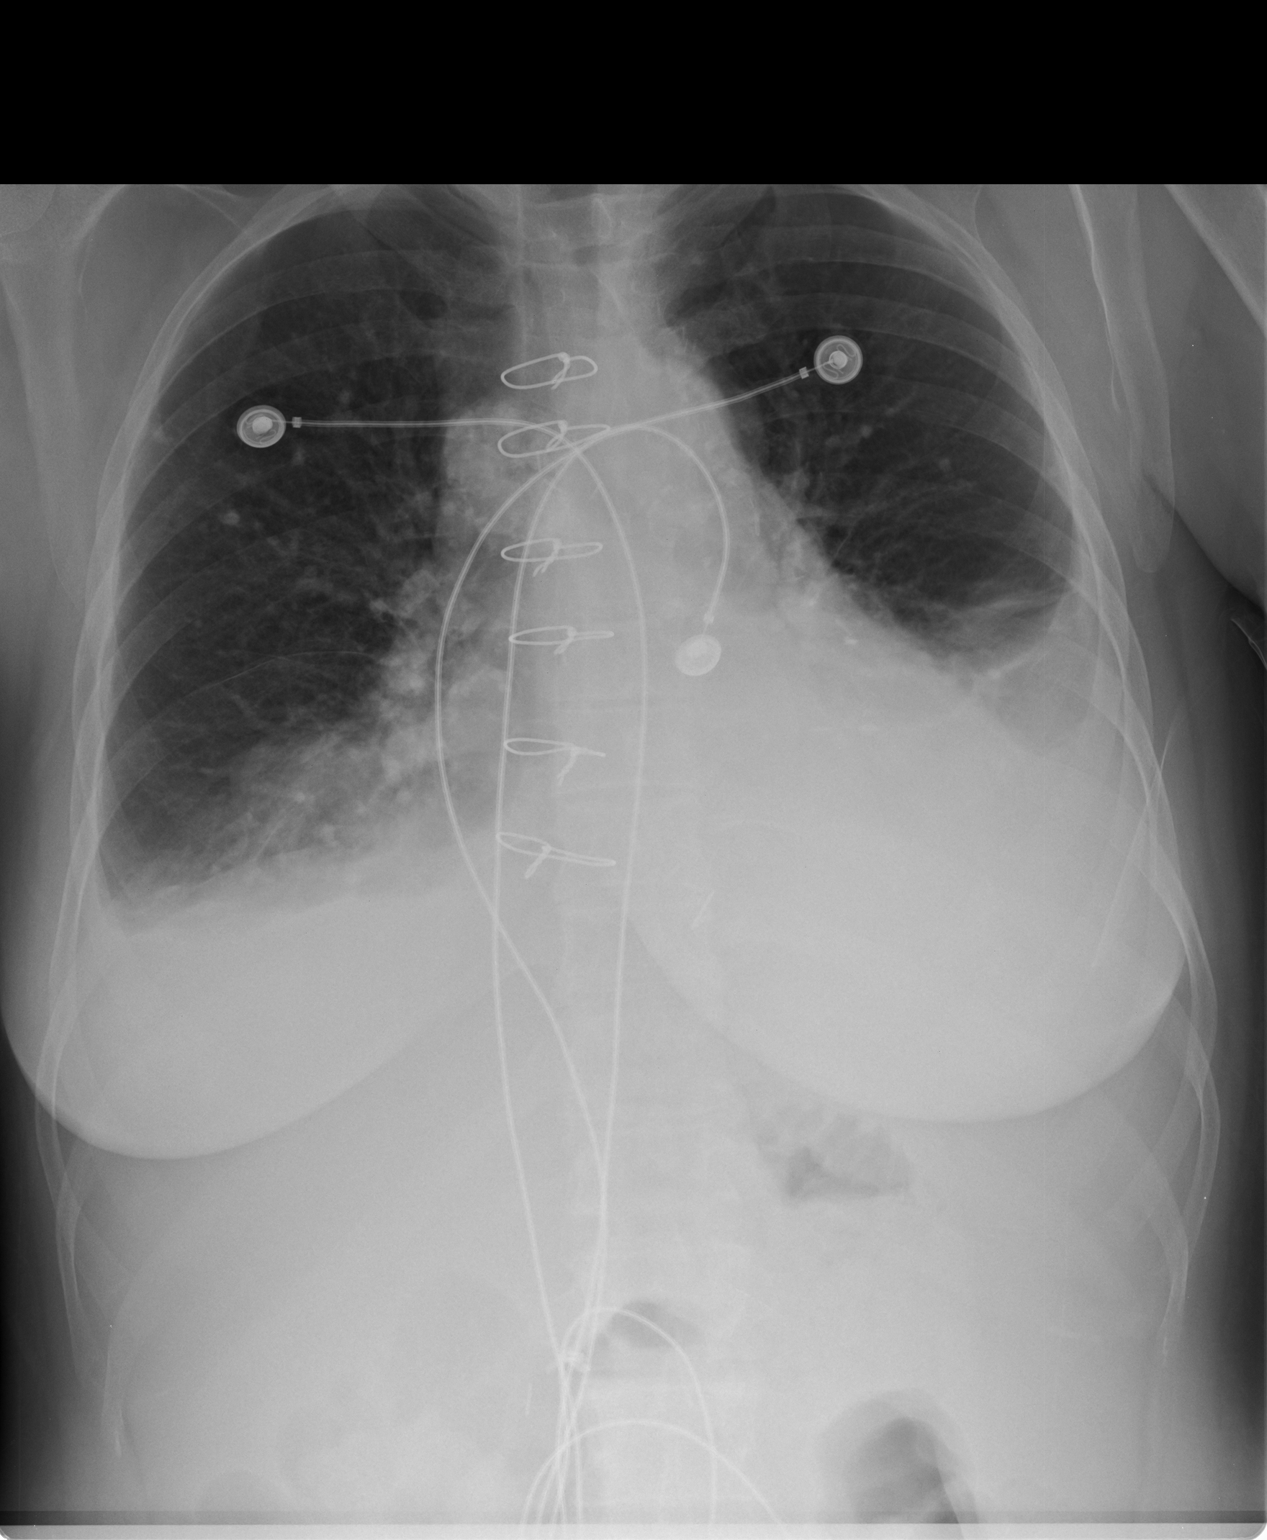

[chest lat]
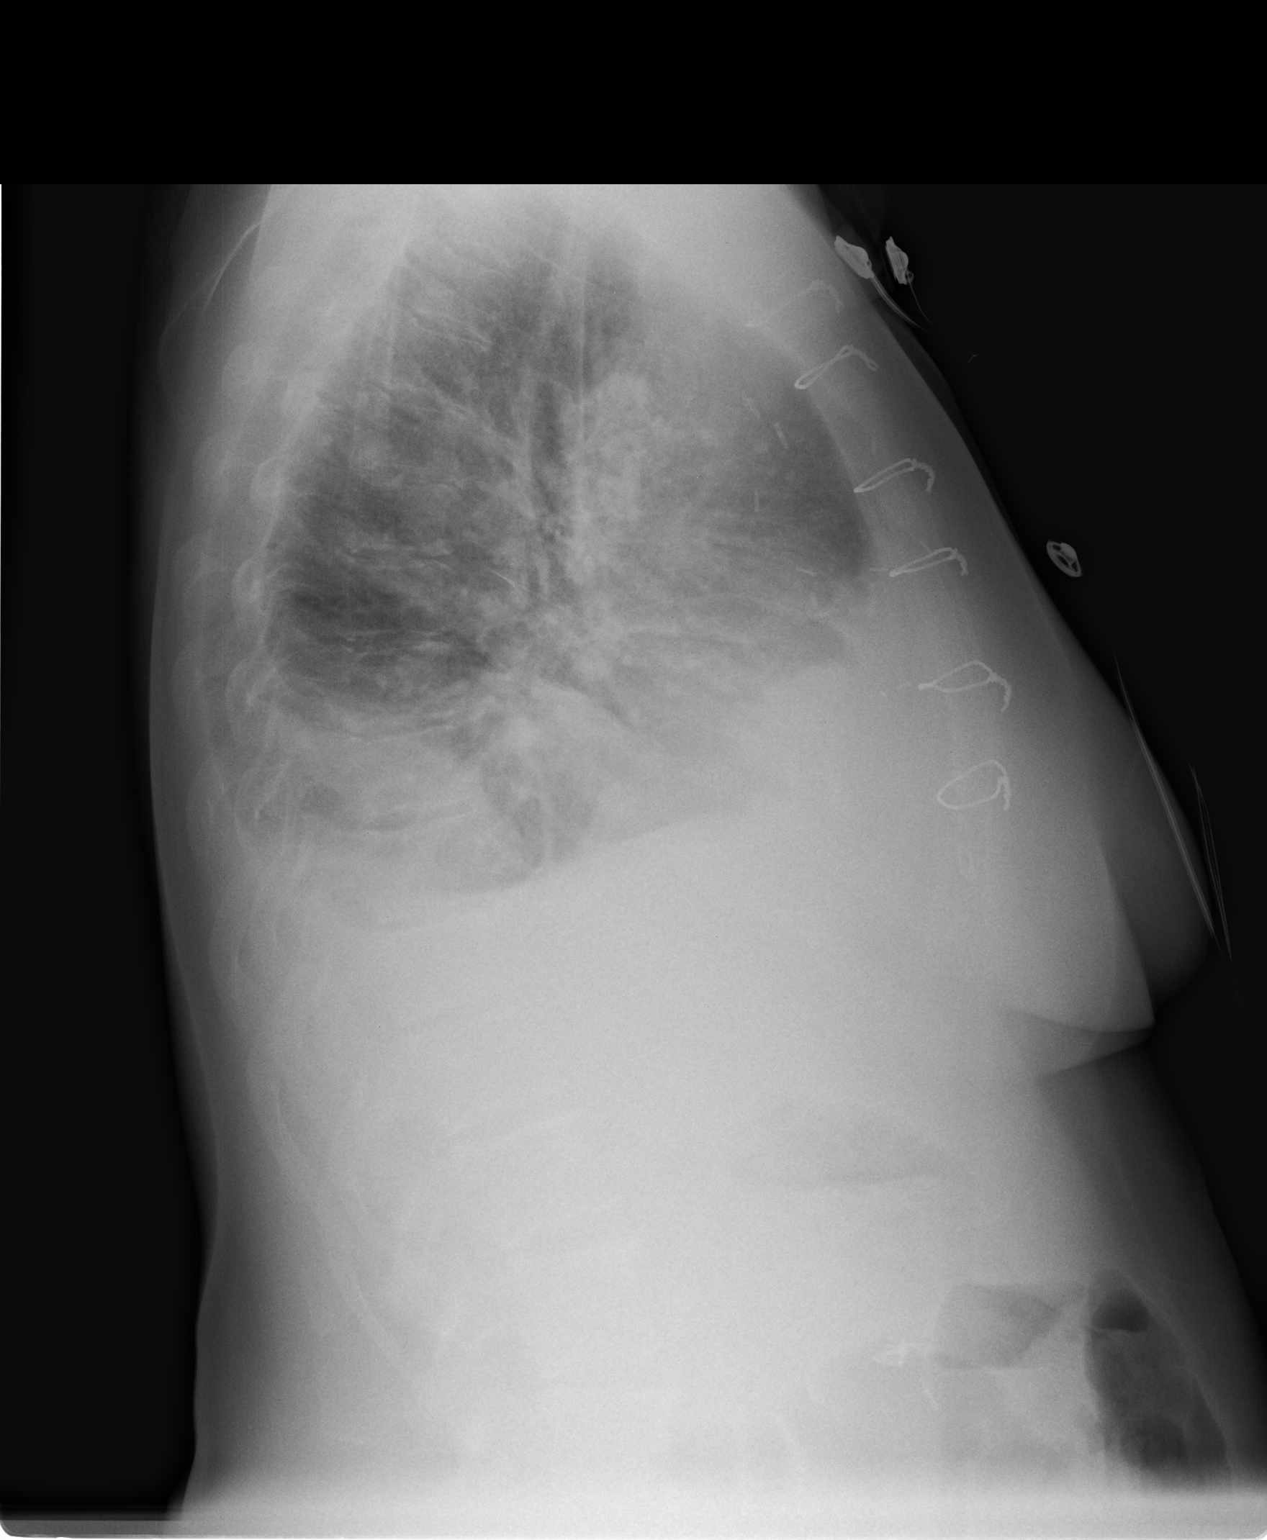

[2 of 2 positions shown; findings below may reference images not displayed]

DIAGNOSTIC STUDIES

EXAM
RADIOLOGICAL EXAMINATION, CHEST; 2 VIEWS FRONTAL AND LATERAL CPT 61969

INDICATION
Chest pain
Pt c/o chest pain and bilateral feet swelling. Open heart surgery x 2 weeks ago. 6 bypass. Hx of
renal failure. AW

TECHNIQUE
PA and lateral view chest

COMPARISONS
Single-view chest February 20, 2016

FINDINGS
There are increasing left greater than right basilar pleural effusions with adjacent compressive
atelectasis versus infiltrates. Previously noted circumscribed mass in the right lung base is
partially obscured. The cardiac silhouette is obscured with median sternotomy wires.

IMPRESSION
Left greater than right bibasilar pleural effusions with adjacent compressive atelectasis versus
infiltrates

Tech Notes:

Pt c/o chest pain and bilateral feet swelling. Open heart surgery x 2 weeks ago. 6 bypass. Hx of
renal failure. AW

## 2020-10-08 ENCOUNTER — Encounter: Admit: 2020-10-08 | Discharge: 2020-10-08 | Payer: MEDICARE | Primary: Family

## 2020-10-08 DIAGNOSIS — I255 Ischemic cardiomyopathy: Secondary | ICD-10-CM

## 2020-10-08 DIAGNOSIS — N184 Chronic kidney disease, stage 4 (severe): Secondary | ICD-10-CM

## 2020-10-08 DIAGNOSIS — I10 Essential (primary) hypertension: Secondary | ICD-10-CM

## 2020-10-08 DIAGNOSIS — K5792 Diverticulitis of intestine, part unspecified, without perforation or abscess without bleeding: Secondary | ICD-10-CM

## 2020-10-08 DIAGNOSIS — I5032 Chronic diastolic (congestive) heart failure: Secondary | ICD-10-CM

## 2020-10-08 DIAGNOSIS — T50905A Adverse effect of unspecified drugs, medicaments and biological substances, initial encounter: Secondary | ICD-10-CM

## 2020-10-08 DIAGNOSIS — I701 Atherosclerosis of renal artery: Secondary | ICD-10-CM

## 2020-10-08 DIAGNOSIS — G35 Multiple sclerosis: Secondary | ICD-10-CM

## 2020-10-08 DIAGNOSIS — F32A Depression: Secondary | ICD-10-CM

## 2020-10-08 DIAGNOSIS — R918 Other nonspecific abnormal finding of lung field: Secondary | ICD-10-CM

## 2020-10-08 DIAGNOSIS — N390 Urinary tract infection, site not specified: Secondary | ICD-10-CM

## 2020-10-08 DIAGNOSIS — E785 Hyperlipidemia, unspecified: Secondary | ICD-10-CM

## 2020-10-08 DIAGNOSIS — I25118 Atherosclerotic heart disease of native coronary artery with other forms of angina pectoris: Secondary | ICD-10-CM

## 2020-10-08 DIAGNOSIS — F319 Bipolar disorder, unspecified: Secondary | ICD-10-CM

## 2020-10-08 MED ORDER — HYDROCHLOROTHIAZIDE 25 MG PO TAB
25 mg | ORAL_TABLET | Freq: Every morning | ORAL | 3 refills | 90.00000 days | Status: AC
Start: 2020-10-08 — End: ?

## 2020-10-16 ENCOUNTER — Encounter: Admit: 2020-10-16 | Discharge: 2020-10-16 | Payer: MEDICARE | Primary: Family

## 2020-10-16 NOTE — Telephone Encounter
Called patient with stress test instructions who gave verbal understanding. NPO the morning of the exam and no caffeine for the 24 hours prior.

## 2020-10-19 ENCOUNTER — Encounter: Admit: 2020-10-19 | Discharge: 2020-10-19 | Payer: MEDICARE | Primary: Family

## 2020-10-19 ENCOUNTER — Ambulatory Visit: Admit: 2020-10-19 | Discharge: 2020-10-19 | Payer: MEDICARE | Primary: Family

## 2020-10-19 DIAGNOSIS — I5032 Chronic diastolic (congestive) heart failure: Secondary | ICD-10-CM

## 2020-10-19 DIAGNOSIS — N184 Chronic kidney disease, stage 4 (severe): Secondary | ICD-10-CM

## 2020-10-19 DIAGNOSIS — I10 Essential (primary) hypertension: Secondary | ICD-10-CM

## 2020-10-19 DIAGNOSIS — I255 Ischemic cardiomyopathy: Secondary | ICD-10-CM

## 2020-10-19 DIAGNOSIS — I25118 Atherosclerotic heart disease of native coronary artery with other forms of angina pectoris: Secondary | ICD-10-CM

## 2020-10-19 MED ORDER — ALBUTEROL SULFATE 90 MCG/ACTUATION IN HFAA
2 | RESPIRATORY_TRACT | 0 refills | Status: AC | PRN
Start: 2020-10-19 — End: ?

## 2020-10-19 MED ORDER — RP DX TL-201 THALLOUS CHL MCI
.75 | Freq: Once | INTRAVENOUS | 0 refills | Status: CP
Start: 2020-10-19 — End: ?

## 2020-10-19 MED ORDER — EUCALYPTUS-MENTHOL MM LOZG
1 | Freq: Once | ORAL | 0 refills | Status: AC | PRN
Start: 2020-10-19 — End: ?

## 2020-10-19 MED ORDER — AMINOPHYLLINE 500 MG/20 ML IV SOLN
50 mg | INTRAVENOUS | 0 refills | Status: AC | PRN
Start: 2020-10-19 — End: ?

## 2020-10-19 MED ORDER — REGADENOSON 0.4 MG/5 ML IV SYRG
.4 mg | Freq: Once | INTRAVENOUS | 0 refills | Status: CP
Start: 2020-10-19 — End: ?

## 2020-10-19 MED ORDER — SODIUM CHLORIDE 0.9 % IV SOLP
250 mL | INTRAVENOUS | 0 refills | Status: AC | PRN
Start: 2020-10-19 — End: ?

## 2020-10-19 MED ORDER — RP DX TL-201 THALLOUS CHL MCI
3 | Freq: Once | INTRAVENOUS | 0 refills | Status: CP
Start: 2020-10-19 — End: ?

## 2020-10-19 MED ORDER — NITROGLYCERIN 0.4 MG SL SUBL
.4 mg | SUBLINGUAL | 0 refills | Status: AC | PRN
Start: 2020-10-19 — End: ?

## 2020-10-20 ENCOUNTER — Encounter: Admit: 2020-10-20 | Discharge: 2020-10-20 | Payer: MEDICARE | Primary: Family

## 2020-10-20 NOTE — Telephone Encounter
-----   Message from Jamison Neighbor, MD sent at 10/20/2020  2:09 PM CST -----  Stress test results released to MyChart; there is no change in treatment plan.  OK to resume physical exercise as tolerated.

## 2020-10-20 NOTE — Telephone Encounter
Left voicemail with results and recommendations. Left call back number for any questions, comments, or concerns.

## 2020-10-30 ENCOUNTER — Encounter: Admit: 2020-10-30 | Discharge: 2020-10-30 | Payer: MEDICARE | Primary: Family

## 2020-11-02 ENCOUNTER — Encounter: Admit: 2020-11-02 | Discharge: 2020-11-02 | Payer: MEDICARE | Primary: Family

## 2020-11-03 ENCOUNTER — Encounter: Admit: 2020-11-03 | Discharge: 2020-11-03 | Payer: MEDICARE | Primary: Family

## 2020-11-05 ENCOUNTER — Encounter: Admit: 2020-11-05 | Discharge: 2020-11-05 | Payer: MEDICARE | Primary: Family

## 2020-11-05 NOTE — Telephone Encounter
New Referral Intake    TO PATIENT:  Thank you for contacting us, how did  you hear about our transplant program? Nephrologist     Is English your primary language? Yes   If no, do you or your support person need an interpreter?      (HARD STOPS)  *Do you have a support person who will be with you before, during, and after a transplant? Yes        Name and Relationship of Support Person: Sister, Larita Fife, able to drive, local   (over 15 yoa, can/will drive & lives locally to you?)  *Would you accept a lifesaving blood transfusion? Yes  *Any active infections? No  *Open wounds or sores? No  *Do you live in a Nursing Facility? No (If YES, at what level? Independent Living, Assisted Living or Skilled/Rehab?)    *Have you had your COVID vaccination series? No- not sure how it would interfere with MS, have discussed with neurologist/PCP/neph  What brand?  How many?    If YES, please send or bring a copy of your vacc card.  If NO, referral nurse will address     *Primary Insurance Company: [facility]  Policy #: [id number]  Group ID:     Secondary Insurance Company: [facility]  Policy #: [id number]  Group ID:     Medicare: [id number]    Who is your kidney doctor?  Dr. Carin Primrose  Who is your primary doctor?  Dr. Karin Golden    Have you had a hospitalization in the past 6 months?  Yes, Research scientist (medical) Well / fell at home and high blood pressure and [facility] [place], COVID    Past Medical History:  (if answer is yes, please specify)    How tall are you?  5 7  How much do you weigh? 183 lbs   Calculated BMI:  28.7    What is the cause of your kidney disease?  Multiple scorosis caused blood pressure issues   Have you ever been diagnosed with Diabetes?  no     What Type?    At what age were you diagnosed?  Do you see an Endocrinologist?     Are you currently on dialysis?  No   Type/Start date (chronic)/ Schedule:     What center:                Do you receive [employer] ([employer]) premium assistance?   If yes, how much per month?     Have you been denied or listed by another transplant center? IF denied, why? no  Are you currently working with any other transplant centers? no  Have you had a previous organ transplant? no    History of:  Heart disease?  Bypass at [place]  Do you have a heart doctor? Dr. Erskine Squibb [place]  History of heart stents?  No   Are you on medication to keep stents open (ex. Coumadin, Plavix, Brilinta- do not include Heparin for dialysis)? Yes, generic for plavix    Are you on medication to help raise your blood pressure (Midodrine)? No     Lung disease? No, If yes, do you see a Pulmonologist?    Are you on oxygen? No  If Yes, how many liters?  with dialysis or all the time?    Do you wear CPAP (breathing machine at night to sleep)? No               Do you use any  marijuana products ? No                 Do you use illegal substances or have you in the past? Used to smoke marijuana   Do you use tobacco or nicotine products or have you in the past? Used to smoke   Packs/cans per day? A pack/ day  for years? 35-40 years  When did you quit? After heart surgery; 2 years ago    (TO PATIENT: If you currently use tobacco or nicotine products, you need to quit to be considered a transplant candidate.)                 Liver disease?  No                            what kind, when diagnosed?   History of HIV or Hepatitis? No    If yes, who treated and where?  Cancer? No     If yes, what kind, when diagnosed, where treated?    Psychiatric illness (Depression/Anxiety/Bipolar Disorder)?  Yes, depression and borderline     Do you see a psychiatrist or counselor for any reason?  Yes, counselor in [place]               Do you use a cane/walker/wheelchair to get around? Sometimes uses walker with seat        Past Surgical History:    Have you had any abdominal surgeries? No  Have you had any surgeries on blood vessels (not including a fistula)? Yes for heart  Have you had any amputations?  No    Demographic and Social History:  (Please do not turn these into a leading questions, ask them as they are written)  What gender were you assigned at birth (female or female)?  female    Do you still identify as a_____? female  What race do you identify with (White, Black, Hispanic, Asian, Pacific Islander)? white      Do you have any interested living donors? No    If so, they are welcome to come to evaluation with you.    We may need you to sign a Release of Information Authorization Form. Do you have access to a fax machine or an e-mail address we can send it to? [email or web address]      TO PATIENT: You will need to bring your support person with you to the transplant evaluation.  This is required to be considered a candidate for transplant.  If you have interested living donors, they are welcome to come with you, as well.       Note to staff -   Obtain the following records:  ? nephrology note  ? hospital discharge summary within 6 month (or most recent)   ? pathology report    Notify Dr. Curt Bears if delay in scheduling due to lack of records     If patient has possible living donor - have them call:  ? [place] - [phone]  ? Lelon Mast [phone]

## 2020-11-11 ENCOUNTER — Encounter: Admit: 2020-11-11 | Discharge: 2020-11-11 | Payer: MEDICARE | Primary: Family

## 2020-11-11 NOTE — Telephone Encounter
Discussed ACS screenings- requested Colonoscopy report/ requested Pap cytology report/ Mammogram needs to be updated. Also discussed the need for the COVID-19 vaccination series for [place]- per CDC website, should not trigger her MS  V/u

## 2020-11-13 ENCOUNTER — Encounter: Admit: 2020-11-13 | Discharge: 2020-11-13 | Payer: MEDICARE | Primary: Family

## 2020-11-13 MED ORDER — ISOSORBIDE MONONITRATE 60 MG PO TB24
60 mg | ORAL_TABLET | Freq: Every morning | ORAL | 3 refills | 30.00000 days | Status: AC
Start: 2020-11-13 — End: ?

## 2020-11-18 ENCOUNTER — Encounter: Admit: 2020-11-18 | Discharge: 2020-11-18 | Payer: MEDICARE | Primary: Family

## 2020-11-23 ENCOUNTER — Encounter: Admit: 2020-11-23 | Discharge: 2020-11-23 | Payer: MEDICARE | Primary: Family

## 2020-11-23 ENCOUNTER — Ambulatory Visit: Admit: 2020-11-23 | Discharge: 2020-11-23 | Payer: MEDICARE | Primary: Family

## 2020-11-23 DIAGNOSIS — Z114 Encounter for screening for human immunodeficiency virus [HIV]: Secondary | ICD-10-CM

## 2020-11-23 DIAGNOSIS — K5792 Diverticulitis of intestine, part unspecified, without perforation or abscess without bleeding: Secondary | ICD-10-CM

## 2020-11-23 DIAGNOSIS — G35 Multiple sclerosis: Secondary | ICD-10-CM

## 2020-11-23 DIAGNOSIS — R918 Other nonspecific abnormal finding of lung field: Secondary | ICD-10-CM

## 2020-11-23 DIAGNOSIS — I701 Atherosclerosis of renal artery: Secondary | ICD-10-CM

## 2020-11-23 DIAGNOSIS — Z7689 Persons encountering health services in other specified circumstances: Secondary | ICD-10-CM

## 2020-11-23 DIAGNOSIS — N184 Chronic kidney disease, stage 4 (severe): Secondary | ICD-10-CM

## 2020-11-23 DIAGNOSIS — F319 Bipolar disorder, unspecified: Secondary | ICD-10-CM

## 2020-11-23 DIAGNOSIS — I10 Essential (primary) hypertension: Secondary | ICD-10-CM

## 2020-11-23 DIAGNOSIS — T50905A Adverse effect of unspecified drugs, medicaments and biological substances, initial encounter: Secondary | ICD-10-CM

## 2020-11-23 DIAGNOSIS — E559 Vitamin D deficiency, unspecified: Secondary | ICD-10-CM

## 2020-11-23 DIAGNOSIS — E785 Hyperlipidemia, unspecified: Secondary | ICD-10-CM

## 2020-11-23 DIAGNOSIS — I255 Ischemic cardiomyopathy: Secondary | ICD-10-CM

## 2020-11-23 DIAGNOSIS — F32A Depression: Secondary | ICD-10-CM

## 2020-11-23 DIAGNOSIS — I25118 Atherosclerotic heart disease of native coronary artery with other forms of angina pectoris: Secondary | ICD-10-CM

## 2020-11-23 DIAGNOSIS — I5032 Chronic diastolic (congestive) heart failure: Secondary | ICD-10-CM

## 2020-11-23 DIAGNOSIS — N390 Urinary tract infection, site not specified: Secondary | ICD-10-CM

## 2020-11-23 DIAGNOSIS — R2990 Unspecified symptoms and signs involving the nervous system: Secondary | ICD-10-CM

## 2020-11-23 LAB — COMPREHENSIVE METABOLIC PANEL
Lab: 0.4 mg/dL (ref 0.3–1.2)
Lab: 103 mg/dL — ABNORMAL HIGH (ref 70–100)
Lab: 105 MMOL/L (ref 98–110)
Lab: 12 K/UL (ref 3–12)
Lab: 14 U/L (ref 7–40)
Lab: 14 mL/min — ABNORMAL LOW (ref >60)
Lab: 142 MMOL/L (ref 137–147)
Lab: 203 U/L — ABNORMAL HIGH (ref 25–110)
Lab: 25 MMOL/L (ref 21–30)
Lab: 27 mg/dL — ABNORMAL HIGH (ref 7–25)
Lab: 3.7 mg/dL — ABNORMAL HIGH (ref 0.4–1.00)
Lab: 4.1 g/dL (ref 3.5–5.0)
Lab: 6.9 g/dL (ref 6.0–8.0)
Lab: 9 U/L (ref 7–56)
Lab: 9.3 mg/dL (ref 8.5–10.6)

## 2020-11-23 LAB — HEPATITIS B SURFACE AB: Lab: NEGATIVE g/dL — ABNORMAL LOW (ref 12.0–15.0)

## 2020-11-23 LAB — HEPATITIS B CORE AB TOT (IGG+IGM)

## 2020-11-23 LAB — HEPATITIS C ANTIBODY W REFLEX HCV PCR QUANT

## 2020-11-23 LAB — HEPATITIS B SURFACE AG

## 2020-11-23 LAB — CBC AND DIFF: Lab: 7.2 K/UL (ref 4.5–11.0)

## 2020-11-23 LAB — HIV 1& 2 AG-AB SCRN W REFLEX HIV 1 PCR QUANT

## 2020-11-25 ENCOUNTER — Encounter: Admit: 2020-11-25 | Discharge: 2020-11-25 | Payer: MEDICARE | Primary: Family

## 2020-11-25 NOTE — Progress Notes
Called patient and left VM  Monday 11/23/20 to request a phone call back to set up a telehealth appointment for follow up.     Requested images to be clouded from [facility]) and there comparision    Also faxed Dr. Harrington Challenger at [phone] [facility]     Attempted to call Dr. Cherly Hensen office to request fax number for medical records.

## 2020-11-26 ENCOUNTER — Encounter: Admit: 2020-11-26 | Discharge: 2020-11-26 | Payer: MEDICARE | Primary: Family

## 2020-11-26 NOTE — Progress Notes
Faxed medical records request to [facility] at [phone] for hospital records from 2022

## 2020-11-30 ENCOUNTER — Encounter: Admit: 2020-11-30 | Discharge: 2020-11-30 | Payer: MEDICARE | Primary: Family

## 2020-12-07 ENCOUNTER — Encounter: Admit: 2020-12-07 | Discharge: 2020-12-07 | Payer: MEDICARE | Primary: Family

## 2020-12-07 NOTE — Telephone Encounter
Per [employer] guidelines [facility] is required to fax clinicals to [facility] transplant for txp benefits and eval auth approval.    [facility] has requested clinicals and will fax them with form to [facility] for kidney txp evaluation benefits and eval auth approval

## 2020-12-07 NOTE — Telephone Encounter
Printed and provided clinicals to [facility]

## 2020-12-09 ENCOUNTER — Encounter: Admit: 2020-12-09 | Discharge: 2020-12-09 | Payer: MEDICARE | Primary: Family

## 2020-12-09 NOTE — Telephone Encounter
LVM for patient to return call for the following:    Also called [facility] again to confirm if patient had MRI imaging done, patient did not have MRI's done in 2022, she has only had CT brain done in January and February. Image request for January CT to be clouded.

## 2020-12-17 ENCOUNTER — Encounter: Admit: 2020-12-17 | Discharge: 2020-12-17 | Payer: MEDICARE | Primary: Family

## 2020-12-17 NOTE — Telephone Encounter
TRANSPLANT BENEFIT COLLECTION:    Verified by: Gearldine Shown  Date: May 5,2022  ID #: [id number] Subscriber: self  Ins Plan: HU[employer]NA MEDICARE EFF:11/13/20 Phone#:[phone]  Plan Type: Value Plus PPO  Deductible: $0.0   Co-ins: 80/20%   Out of Pocket: $7550  Inpt Copay: [id number]  Outpt Copay: $75  OV PCP/Spec Copay: $0.0  Plan limitations/concerns: [place] is in network for transplant  [place] MEDICAID [place] PICKS UP REMAINDER OF COPAYS AND COINSURANCE LEAVING PATIENT WITH $0.00 FINANCIAL LIABILITY  Donor Benefits: covered  Travel/lodging-Recipient:/ Donor: not covered  Spoke with: Laban Emperor      Pt D RX PlanVira [id number] Phone [phone]  30 day retail/ 90 day m/o cost:  $3.95 - $9.85  Valcyte: not covered  Generic: $3.95  Everolimus: PA required  Envarsus: PA required  Current Subsidy: FULL EXTRA HELP      SECONDARY    ID #: [id number] Subscriber: SELF  Ins Plan: Silvio Pate QMB Phone#:[phone]  Plan Type: [place] Medicaid  NO SPEND DOWN REPORTED AS OF 12/17/20  $48 INPT ADMIT COPAY  NURSING HOME/LONG TERM CARE [place] $3/VISIT  SURGICENTER $3/DOS  OUTPT REHAB $1/VISIT  DME $3/CLAIM  HHC $3/VISIT  NON-EMERGENCY TRANSPORT OR AMBULANCE $3/DOS  OV $2/COPAY  [place] [phone], $3/VISIT  DENTAL $3/DOS  RX $3/FILL (NO MAIL ORDER)  If 2ndry to Medicare: PAYS [place] FOR IMMUNOS AFTER PART B  **IS IT COVERED? CALL [phone]  NO AUTHORIZATION REQUIRED WHEN SECONDARY     TXP Network:HUMANA  NCM: Laban Emperor  Phone #: [phone]  [place] #: [phone]  Auth requirements: Elizabeth Sauer and listing Josem Kaufmann required

## 2020-12-18 ENCOUNTER — Encounter: Admit: 2020-12-18 | Discharge: 2020-12-18 | Payer: MEDICARE | Primary: Family

## 2020-12-18 DIAGNOSIS — Z01818 Encounter for other preprocedural examination: Secondary | ICD-10-CM

## 2020-12-18 NOTE — Telephone Encounter
TRANSPLANT AUTHORIZATIONS    PLAN: Debra Hall Phase 1 Evaluation  Auth [id number] eff: 12/07/2020 - 12/06/2021  NCM: Debra Billow D.  Phone #: [phone]  FAX #: [phone]  Comments/request: Global authorization- All testing and high tech radiology is covered under this auth approval

## 2020-12-21 ENCOUNTER — Encounter: Admit: 2020-12-21 | Discharge: 2020-12-21 | Payer: MEDICARE | Primary: Family

## 2020-12-22 ENCOUNTER — Encounter: Admit: 2020-12-22 | Discharge: 2020-12-22 | Payer: MEDICARE | Primary: Family

## 2020-12-25 ENCOUNTER — Encounter: Admit: 2020-12-25 | Discharge: 2020-12-25 | Payer: MEDICARE | Primary: Family

## 2020-12-27 ENCOUNTER — Encounter: Admit: 2020-12-27 | Discharge: 2020-12-27 | Payer: MEDICARE | Primary: Family

## 2020-12-27 DIAGNOSIS — E785 Hyperlipidemia, unspecified: Secondary | ICD-10-CM

## 2020-12-27 DIAGNOSIS — F32A Depression: Secondary | ICD-10-CM

## 2020-12-27 DIAGNOSIS — R918 Other nonspecific abnormal finding of lung field: Secondary | ICD-10-CM

## 2020-12-27 DIAGNOSIS — F319 Bipolar disorder, unspecified: Secondary | ICD-10-CM

## 2020-12-27 DIAGNOSIS — G35 Multiple sclerosis: Secondary | ICD-10-CM

## 2020-12-27 DIAGNOSIS — N390 Urinary tract infection, site not specified: Secondary | ICD-10-CM

## 2020-12-27 DIAGNOSIS — I10 Essential (primary) hypertension: Secondary | ICD-10-CM

## 2020-12-27 DIAGNOSIS — I701 Atherosclerosis of renal artery: Secondary | ICD-10-CM

## 2020-12-27 DIAGNOSIS — K5792 Diverticulitis of intestine, part unspecified, without perforation or abscess without bleeding: Secondary | ICD-10-CM

## 2020-12-27 DIAGNOSIS — T50905A Adverse effect of unspecified drugs, medicaments and biological substances, initial encounter: Secondary | ICD-10-CM

## 2020-12-28 ENCOUNTER — Encounter: Admit: 2020-12-28 | Discharge: 2020-12-28 | Payer: MEDICARE | Primary: Family

## 2020-12-28 ENCOUNTER — Ambulatory Visit: Admit: 2020-12-28 | Discharge: 2020-12-28 | Payer: MEDICARE | Primary: Family

## 2020-12-28 DIAGNOSIS — N186 End stage renal disease: Secondary | ICD-10-CM

## 2020-12-28 DIAGNOSIS — I10 Essential (primary) hypertension: Secondary | ICD-10-CM

## 2020-12-28 DIAGNOSIS — G35 Multiple sclerosis: Secondary | ICD-10-CM

## 2020-12-28 DIAGNOSIS — Z0389 Encounter for observation for other suspected diseases and conditions ruled out: Secondary | ICD-10-CM

## 2020-12-28 DIAGNOSIS — I701 Atherosclerosis of renal artery: Secondary | ICD-10-CM

## 2020-12-28 DIAGNOSIS — K5792 Diverticulitis of intestine, part unspecified, without perforation or abscess without bleeding: Secondary | ICD-10-CM

## 2020-12-28 DIAGNOSIS — F319 Bipolar disorder, unspecified: Secondary | ICD-10-CM

## 2020-12-28 DIAGNOSIS — T50905A Adverse effect of unspecified drugs, medicaments and biological substances, initial encounter: Secondary | ICD-10-CM

## 2020-12-28 DIAGNOSIS — N390 Urinary tract infection, site not specified: Secondary | ICD-10-CM

## 2020-12-28 DIAGNOSIS — Z01818 Encounter for other preprocedural examination: Secondary | ICD-10-CM

## 2020-12-28 DIAGNOSIS — E785 Hyperlipidemia, unspecified: Secondary | ICD-10-CM

## 2020-12-28 DIAGNOSIS — F32A Depression: Secondary | ICD-10-CM

## 2020-12-28 DIAGNOSIS — Z114 Encounter for screening for human immunodeficiency virus [HIV]: Secondary | ICD-10-CM

## 2020-12-28 DIAGNOSIS — R918 Other nonspecific abnormal finding of lung field: Secondary | ICD-10-CM

## 2020-12-28 LAB — HIV 1& 2 AG-AB SCRN W REFLEX HIV 1 PCR QUANT

## 2020-12-28 LAB — HEPATITIS B SURFACE AG

## 2020-12-28 LAB — COMPREHENSIVE METABOLIC PANEL
ALBUMIN: 4 g/dL (ref 3.5–5.0)
BLD UREA NITROGEN: 21 mg/dL (ref 7–25)
CALCIUM: 8.9 mg/dL (ref 8.5–10.6)
CREATININE: 3.2 mg/dL — ABNORMAL HIGH (ref 0.4–1.00)
GLUCOSE,PANEL: 85 mg/dL (ref 70–100)
POTASSIUM: 4.1 MMOL/L (ref 3.5–5.1)
SODIUM: 144 MMOL/L (ref 137–147)
TOTAL BILIRUBIN: 0.5 mg/dL (ref 0.3–1.2)
TOTAL PROTEIN: 7.1 g/dL (ref 6.0–8.0)

## 2020-12-28 LAB — HEPATITIS C ANTIBODY W REFLEX HCV PCR QUANT

## 2020-12-28 LAB — CBC
HEMATOCRIT: 34 % — ABNORMAL LOW (ref 36–45)
HEMOGLOBIN: 11 g/dL — ABNORMAL LOW (ref 12.0–15.0)
MCH: 28 pg — ABNORMAL LOW (ref >60)
MCHC: 34 g/dL (ref 32.0–36.0)
MCV: 83 FL (ref 80–100)
MPV: 8.2 FL (ref 7–11)
RBC COUNT: 4.1 M/UL (ref 4.0–5.0)
RDW: 14 % (ref 11–15)
WBC COUNT: 5.8 K/UL — ABNORMAL HIGH (ref 4.5–11.0)

## 2020-12-28 LAB — TOXOPLASMA IGG: TOXOPLASMA IGG: NEGATIVE

## 2020-12-28 LAB — HEPATITIS B SURFACE AB: HEP B SURFACE ABY: NEGATIVE

## 2020-12-28 LAB — PROTEIN/CR RATIO,UR RAN
PROT CREAT RAT/CAL: 1.2 — ABNORMAL HIGH (ref <0.15)
UR CREATININE, RAN: 179 mg/dL
UR TOTAL PROTEIN,RAN: 219 mg/dL

## 2020-12-28 LAB — BILIRUBIN, DIRECT

## 2020-12-28 LAB — PROTIME INR (PT)
INR: 1 (ref 0.8–1.2)
PROTIME: 10 s (ref 9.5–14.2)

## 2020-12-28 LAB — SYPHILIS AB SCREEN: SYPHILIS AB, TOTAL: NEGATIVE

## 2020-12-28 LAB — PHOSPHORUS: PHOSPHORUS: 4.3 mg/dL (ref 2.0–4.5)

## 2020-12-28 LAB — HEPATITIS B CORE AB TOT (IGG+IGM)

## 2020-12-28 LAB — GGTP: GGTP: 16 U/L (ref 9–64)

## 2020-12-28 NOTE — Telephone Encounter
Patient confirmed viewing of Pre Transplant Education video with support person prior to patient's in person CFT clinic appointment.  Patient given the opportunity to ask questions, has no questions. Patient verbalized understanding of all information.

## 2020-12-28 NOTE — Progress Notes
Transplant Pharmacy Evaluation    I met with Debra Hall for their pharmacy transplant evaluation.    Patient Assessment    Current antiplatelet/anticoagulant use: yes; Plavix 75mg  daily    Medication Adherence Assessment  Method(s) used to manage medications: Pill pack through [employer] [facility]    Key (0=never, 1=rarely, 2=sometimes, 3=often, 4=always)   How often do you forget to take one or more of your prescription medications?: 1   - might miss medications twice a week due to napping  How often do you run out of one or more of your prescription medications before getting a new refill?: 0  How often do you have difficulty affording your prescription medications?: 0  How often does your pharmacy have difficulty filling your prescription medications?: 0  Total points: 1  Assessment: Adherence is good. Patient reports using a pill pack to take medications. She reports she was confused in the past when managing her own pillbox    Education  During this visit the patient was provided with an overview of therapies that will be initiated post-transplant including but not limited to significant side effects and dosing schedules. Emphasis was placed on the importance of medication adherence both pre- and post-transplant.     Assessment and Recommendations  I had the pleasure of meeting with Debra Hall for a transplant pharmacy evaluation. Debra Hall demonstrates good medication adherence.  All questions were addressed at the time of our encounter. Debra Hall expressed understanding and was encouraged to contact the pharmacist with follow-up questions.    Potential drug interactions: None  Potential challenges with transplantation: Plavix; will need close follow up with pharmacist after transplant. Will need strong support    Recommendations: There are medication related challenges with transplantation. The following strategies are recommended to mitigate those challenges: Alternative to Plavix needed prior to transplant.    A medication history and reconciliation were performed (including prescription medications, supplements, over the counter, and herbal products). The medication list was updated, and the patient's current medication list is included below.    Home Medications    Medication Sig   acetaminophen (TYLENOL) 325 mg tablet Take two tablets by mouth every 6 hours as needed.   albuterol sulfate (PROAIR HFA) 90 mcg/actuation HFA aerosol inhaler Inhale 2 puffs by mouth into the lungs every 6 hours as needed for Wheezing or Shortness of Breath. Shake well before use.   amLODIPine (NORVASC) 10 mg tablet TAKE ONE TABLET BY MOUTH EVERY DAY   aspirin EC 81 mg tablet Take one tablet by mouth daily. Take with food.   atorvastatin (LIPITOR) 40 mg tablet Take one tablet by mouth at bedtime daily.   carvediloL (COREG) 25 mg tablet TAKE ONE TABLET BY MOUTH TWICE DAILY WITH FOOD   cholecalciferol (vitamin D3) (VITAMIN D3) 1,000 units tablet Take 2,000 Units by mouth daily.   clopiDOGrel (PLAVIX) 75 mg tablet Take 75 mg by mouth daily.   diphenhydrAMINE (BENADRYL) 25 mg capsule Take 25 mg by mouth as Needed.   docusate (COLACE) 100 mg capsule Take 100 mg by mouth as Needed.   duloxetine DR (CYMBALTA) 60 mg capsule Take 60 mg by mouth twice daily.   EPINEPHrine (EPIPEN) 1 mg/mL injection pen (2-Pack) Inject 0.3 mg into the muscle once as needed. Inject 0.3 mg (1 Pen) into thigh if needed for anaphylactic reaction. May repeat in 5-15 minutes if needed.   fexofenadine (ALLEGRA) 180 mg tablet Take 180 mg by mouth daily.   fluticasone  propionate (FLONASE) 50 mcg/actuation nasal spray Apply 2 sprays to each nostril as directed as Needed.   gabapentin (NEURONTIN) 300 mg capsule Take 300 mg by mouth every 8 hours.   hydrALAZINE (APRESOLINE) 100 mg tablet TAKE ONE TABLET BY MOUTH THREE TIMES DAILY   isosorbide mononitrate ER (IMDUR) 60 mg tablet TAKE ONE TABLET BY MOUTH EVERY MORNING   LORazepam (ATIVAN) 1 mg tablet Take 1 mg by mouth twice daily as needed.   mirabegron [facility] ER) 25 mg tablet Take 25 mg by mouth daily.   multivit with calcium,iron,min [facility] DAILY MULTIVITAMIN PO) Take 1 tablet by mouth daily.   nitroglycerin (NITROSTAT) 0.4 mg tablet DISSOLVE ONE TABLET UNDER TONGUE EVERY FIVE MINUTES AS NEEDED FOR CHEST PAIN. *DO NOT EXCEED A TOTAL OF THREE DOSES IN 15 MINUTES. CALL 911 IF CHEST PAIN PERSISTS AFTER THREE DOSES*   pantoprazole DR (PROTONIX) 40 mg tablet Take 40 mg by mouth daily.   potassium chloride (MICRO-K) 10 mEq capsule Take three capsules by mouth as directed. 3 tablets twice a day with torsemide.   ranolazine ER (RANEXA) 500 mg tablet Take 500 mg by mouth twice daily.   torsemide (DEMADEX) 20 mg tablet Take two tablets by mouth twice daily.       Lonzo Cloud, PHARMD

## 2020-12-29 ENCOUNTER — Encounter: Admit: 2020-12-29 | Discharge: 2020-12-29 | Payer: MEDICARE | Primary: Family

## 2020-12-29 DIAGNOSIS — E785 Hyperlipidemia, unspecified: Secondary | ICD-10-CM

## 2020-12-29 DIAGNOSIS — K5792 Diverticulitis of intestine, part unspecified, without perforation or abscess without bleeding: Secondary | ICD-10-CM

## 2020-12-29 DIAGNOSIS — F32A Depression: Secondary | ICD-10-CM

## 2020-12-29 DIAGNOSIS — T50905A Adverse effect of unspecified drugs, medicaments and biological substances, initial encounter: Secondary | ICD-10-CM

## 2020-12-29 DIAGNOSIS — I701 Atherosclerosis of renal artery: Secondary | ICD-10-CM

## 2020-12-29 DIAGNOSIS — R918 Other nonspecific abnormal finding of lung field: Secondary | ICD-10-CM

## 2020-12-29 DIAGNOSIS — G35 Multiple sclerosis: Secondary | ICD-10-CM

## 2020-12-29 DIAGNOSIS — F319 Bipolar disorder, unspecified: Secondary | ICD-10-CM

## 2020-12-29 DIAGNOSIS — I10 Essential (primary) hypertension: Secondary | ICD-10-CM

## 2020-12-29 DIAGNOSIS — N390 Urinary tract infection, site not specified: Secondary | ICD-10-CM

## 2020-12-30 ENCOUNTER — Encounter: Admit: 2020-12-30 | Discharge: 2020-12-30 | Payer: MEDICARE | Primary: Family

## 2021-01-01 ENCOUNTER — Encounter: Admit: 2021-01-01 | Discharge: 2021-01-01 | Payer: MEDICARE | Primary: Family

## 2021-01-01 DIAGNOSIS — N186 End stage renal disease: Secondary | ICD-10-CM

## 2021-01-01 DIAGNOSIS — Z01818 Encounter for other preprocedural examination: Secondary | ICD-10-CM

## 2021-01-01 NOTE — Telephone Encounter
Discussed in selection committee 12/30/20. Deferred due to incomplete eval.   Contacted pt to update. Pt v/u of all info.  Per pt, she completed PAP and mammo @ [facility] in [place], will talk to PCP re: colonoscopy.  Composed deferred letter and e faxed to ref phys.  Submitted hard copy of deferred letter for delivery to pt's home address.  Entered order for PT, psych and pulm consults into Epic.

## 2021-01-05 ENCOUNTER — Encounter: Admit: 2021-01-05 | Discharge: 2021-01-05 | Payer: MEDICARE | Primary: Family

## 2021-01-14 ENCOUNTER — Encounter: Admit: 2021-01-14 | Discharge: 2021-01-14 | Payer: MEDICARE | Primary: Family

## 2021-01-25 ENCOUNTER — Encounter: Admit: 2021-01-25 | Discharge: 2021-01-25 | Payer: MEDICARE | Primary: Family

## 2021-01-25 MED ORDER — HYDRALAZINE 100 MG PO TAB
100 mg | ORAL_TABLET | Freq: Three times a day (TID) | ORAL | 3 refills | 30.00000 days | Status: AC
Start: 2021-01-25 — End: ?

## 2021-02-04 ENCOUNTER — Encounter: Admit: 2021-02-04 | Discharge: 2021-02-04 | Payer: MEDICARE | Primary: Family

## 2021-02-23 ENCOUNTER — Encounter: Admit: 2021-02-23 | Discharge: 2021-02-23 | Payer: MEDICARE | Primary: Family

## 2021-02-23 MED ORDER — CARVEDILOL 25 MG PO TAB
ORAL_TABLET | Freq: Two times a day (BID) | ORAL | 3 refills | 30.00000 days | Status: AC
Start: 2021-02-23 — End: ?

## 2021-02-23 NOTE — Telephone Encounter
Noted that pt was sched for psych with Dr. Levonne Spiller 6/23 but was a no show. Noted that pt also no showed for her CVM appt in May, has not resched.   Pt is sched for PFTs and pulm appt with Dr. Orland Penman 7/15.  E faxed STAT request for PAP and mammogram to [facility] in [place], [place]

## 2021-03-01 ENCOUNTER — Encounter: Admit: 2021-03-01 | Discharge: 2021-03-01 | Payer: MEDICARE | Primary: Family

## 2021-03-23 ENCOUNTER — Encounter: Admit: 2021-03-23 | Discharge: 2021-03-23 | Payer: MEDICARE | Primary: Family

## 2021-03-23 MED ORDER — TORSEMIDE 20 MG PO TAB
40 mg | ORAL_TABLET | Freq: Two times a day (BID) | ORAL | 1 refills | 30.00000 days | Status: AC
Start: 2021-03-23 — End: ?

## 2021-03-23 MED ORDER — POTASSIUM CHLORIDE 10 MEQ PO CPER
ORAL_CAPSULE | Freq: Two times a day (BID) | 3 refills | 30.00000 days | Status: AC
Start: 2021-03-23 — End: ?

## 2021-03-24 ENCOUNTER — Encounter: Admit: 2021-03-24 | Discharge: 2021-03-24 | Payer: MEDICARE | Primary: Family

## 2021-04-09 ENCOUNTER — Encounter: Admit: 2021-04-09 | Discharge: 2021-04-09 | Payer: MEDICARE | Primary: Family

## 2021-04-10 ENCOUNTER — Encounter: Admit: 2021-04-10 | Discharge: 2021-04-10 | Payer: MEDICARE | Primary: Family

## 2021-04-10 ENCOUNTER — Inpatient Hospital Stay: Admit: 2021-04-10 | Payer: MEDICARE

## 2021-04-10 DIAGNOSIS — K5792 Diverticulitis of intestine, part unspecified, without perforation or abscess without bleeding: Secondary | ICD-10-CM

## 2021-04-10 DIAGNOSIS — F319 Bipolar disorder, unspecified: Secondary | ICD-10-CM

## 2021-04-10 DIAGNOSIS — T50905A Adverse effect of unspecified drugs, medicaments and biological substances, initial encounter: Secondary | ICD-10-CM

## 2021-04-10 DIAGNOSIS — I10 Essential (primary) hypertension: Secondary | ICD-10-CM

## 2021-04-10 DIAGNOSIS — G35 Multiple sclerosis: Secondary | ICD-10-CM

## 2021-04-10 DIAGNOSIS — F32A Depression: Secondary | ICD-10-CM

## 2021-04-10 DIAGNOSIS — I701 Atherosclerosis of renal artery: Secondary | ICD-10-CM

## 2021-04-10 DIAGNOSIS — N390 Urinary tract infection, site not specified: Secondary | ICD-10-CM

## 2021-04-10 DIAGNOSIS — E785 Hyperlipidemia, unspecified: Secondary | ICD-10-CM

## 2021-04-10 DIAGNOSIS — R918 Other nonspecific abnormal finding of lung field: Secondary | ICD-10-CM

## 2021-04-10 MED ORDER — ATORVASTATIN 40 MG PO TAB
40 mg | Freq: Every evening | ORAL | 0 refills | Status: AC
Start: 2021-04-10 — End: ?
  Administered 2021-04-11 – 2021-04-15 (×5): 40 mg via ORAL

## 2021-04-10 MED ORDER — ASPIRIN 81 MG PO TBEC
81 mg | Freq: Every day | ORAL | 0 refills | Status: AC
Start: 2021-04-10 — End: ?
  Administered 2021-04-10 – 2021-04-15 (×6): 81 mg via ORAL

## 2021-04-10 MED ORDER — HYDROXYZINE HCL 25 MG PO TAB
25 mg | ORAL | 0 refills | Status: AC | PRN
Start: 2021-04-10 — End: ?
  Administered 2021-04-11 – 2021-04-14 (×2): 25 mg via ORAL

## 2021-04-10 MED ORDER — TRAZODONE 50 MG PO TAB
50 mg | Freq: Every evening | ORAL | 0 refills | Status: AC | PRN
Start: 2021-04-10 — End: ?
  Administered 2021-04-11 – 2021-04-15 (×2): 50 mg via ORAL

## 2021-04-10 MED ORDER — CALCIUM CARBONATE 200 MG CALCIUM (500 MG) PO CHEW
1000 mg | Freq: Three times a day (TID) | ORAL | 0 refills | Status: AC | PRN
Start: 2021-04-10 — End: ?

## 2021-04-10 MED ORDER — ACETAMINOPHEN 325 MG PO TAB
650 mg | ORAL | 0 refills | Status: AC | PRN
Start: 2021-04-10 — End: ?
  Administered 2021-04-13 – 2021-04-14 (×2): 650 mg via ORAL

## 2021-04-10 MED ORDER — AMLODIPINE 5 MG PO TAB
10 mg | Freq: Every day | ORAL | 0 refills | Status: AC
Start: 2021-04-10 — End: ?
  Administered 2021-04-10 – 2021-04-15 (×6): 10 mg via ORAL

## 2021-04-10 MED ORDER — DIPHENHYDRAMINE HCL 50 MG/ML IJ SOLN
50 mg | INTRAMUSCULAR | 0 refills | Status: AC | PRN
Start: 2021-04-10 — End: ?

## 2021-04-10 MED ORDER — OLANZAPINE 10 MG IM SOLR
5 mg | INTRAMUSCULAR | 0 refills | Status: AC | PRN
Start: 2021-04-10 — End: ?

## 2021-04-10 MED ORDER — POLYETHYLENE GLYCOL 3350 17 GRAM PO PWPK
1 | Freq: Every day | ORAL | 0 refills | Status: AC | PRN
Start: 2021-04-10 — End: ?

## 2021-04-10 MED ORDER — OLANZAPINE 5 MG PO TBDI
5 mg | ORAL | 0 refills | Status: AC | PRN
Start: 2021-04-10 — End: ?

## 2021-04-10 MED ORDER — TORSEMIDE 20 MG PO TAB
40 mg | Freq: Two times a day (BID) | ORAL | 0 refills | Status: AC
Start: 2021-04-10 — End: ?
  Administered 2021-04-11 (×2): 40 mg via ORAL

## 2021-04-10 MED ORDER — CARVEDILOL 25 MG PO TAB
25 mg | Freq: Two times a day (BID) | ORAL | 0 refills | Status: AC
Start: 2021-04-10 — End: ?
  Administered 2021-04-11 – 2021-04-15 (×8): 25 mg via ORAL

## 2021-04-10 NOTE — Progress Notes
PSYCHIATRIC NURSING ADMISSION ASSESSMENT     NAME:Debra Hall             MRN: [id number]             DOB:Jan 17, 1971          AGE: 50 y.o.  ADMISSION DATE: 04/10/2021             DAYS ADMITTED: LOS: 0 days    Patient presents to the unit from: Outside Facility (comment which facility) [facility] ED    Patient states the reason for admission is I don't care if I live of die, it just doesn't seem like it would matter. Pt feels extreme guilt about feeling this way she she feels lucky to be alive after all the medical problems she has had and is currently on the Kidney transplant list at [facility]. Pt reports having several open heart surgeries that she did not plan to survive. Pt reports feeling numb to life and wants to feel hope again that life can become enjoyable. Pt reports 1 prior attempt 20 years ago to end her life by cutting and then 1 week ago she had a thought to hang herself with a belt at home and that scared her as she states she would not want someone to find her like that as she has found a relative that way and it was traumatic for her.     Pt reports 5/10 anxiety and 6/10 depression. States this is her baseline and she expects it will not get better. Pt presents with increasing helpless and hopelessness. A protective factor is her grandchildren who she liked spending time with.     Pt reports she is compliant with medications.     Skin assessment negative for all findings, scars noted midline from neck to public bone from open heart surgery as well as other abdominal surgeries throughout the years. Pt did not present with contraband or tattoos or piercing's. Pt is currently wearing an incontinence brief and states she has been incontinent for almost 5 years related to the CKD and medication side effects.     Mental Status Exam  Legal Status: Voluntary Admission  General Appearance: Worried  Mood / Affect: Anxious  Speech: Normal  Content Of Thought: Ideas of Hopelessness, Ideas of Worthlessness  Motor Activity: Normal  Flow of Thought: Normal  Insight / Judgment: Fair judgment, Fair insight  Behavior: Calm, Cooperative, Follows commands  Patient Strengths: Access to housing/residential stability, Knowledge of medications, Interpersonal relationships and supports, i.e. family, friends, peers    CAIGE AID  Have you ever felt you ought to cut down on your drinking or drug use?: No  Have people annoyed you by criticizing your drinking or drug use?: No  Have you felt bad or guilty about your drinking or drug use?: No  Have you ever had a drink or used drugs first thing in the morning to steady your nerves or to get rid of a hangover (eye-opener)?: No  Total Score: 0    AUDIT-C  How Often Drink w/ Alcohol?: Never  # Drinks w/ Alcohol In Typical Day?: 0 drinks  How Often 6+ Drinks Per Occasion?: Never  AUDIT-C Total Score: 0    Contraband/Body Checks  Patient Searched: Yes  Belongings Searched: Yes  Head Lice Check: Yes    Program Orientation  Program Orientation: Program Handout, Visiting Hours, Telephone  Patient's Rights Given/Reviewed: Yes    Do you have access to firearms/weapons? No  If yes, who will  secure them prior to discharge? NA     Gunnar Bulla, RN  04/10/2021

## 2021-04-10 NOTE — Care Plan
Problem: Discharge Planning  Goal: Participation in plan of care  Outcome: Goal Ongoing  Goal: Knowledge regarding plan of care  Outcome: Goal Ongoing  Goal: Prepared for discharge  Outcome: Goal Ongoing     Problem: High Fall Risk  Goal: High Fall Risk  Outcome: Goal Ongoing     Problem: Skin Integrity  Goal: Skin integrity intact  Outcome: Goal Ongoing

## 2021-04-10 NOTE — Progress Notes
N2N Report: L.M. 50 yo F   Name/Title/Phone Number of person providing N2N Information: Wandra Mannan [phone]     Location of Patient: [facility]     Allergies: Nicardipine, Penicillins, Cephalosporins, Ciprofloxacin, Contrast Dye Iv, Iodine Containing [Iodinated Contrast Media], Ibuprofen, Olmesartan    Most Recent Vitals:  BP: 1308/76   HR: 82  T: 98.8  RR: 18  O2: 98%  Recent blood sugar and time obtained, if Diabetic? NA   Pain? 5/10      Current Psychiatric Symptoms:  Reason for psychiatric admission including clinical information: Depression. Patient reports that she is not suicidal or homicidal but does not care if she lives or dies.  Per SW assessment, pt thought about hanging herself at home because the voices were telling her to.     Patient states she was at [facility] last week and was discharged from inpatient psych too early.    Psychosocial (Voluntary Status, DPOA/Guardian, Social Support, Current Living Situation, etc.):  Voluntary   Lives ind in an apartment with other family and children at home      Medical Interventions Needed or PRN's:  Ativan 1mg  PO 0008   Tylenol for back pain this AM     Medical Issues and Diagnosis:  EKG results (include any abnormalities and Qtc):   SR, rate 86, QTc 446    Past Medical History        Patient Active Problem List     Diagnosis Date Noted   ? Volume overload 06/29/2018   ? 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 Acute blood loss anemia 06/12/2018   ? Hyperkalemia 06/12/2018   ? 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 CAD (coronary artery disease), native coronary artery 06/07/2018       06/04/18: Admitted with type 2 NSTEMI due to hypertensive emergency   ? Type 2 myocardial infarction (HCC) 06/05/2018   ? CKD (chronic kidney disease) stage 4, GFR 15-29 ml/min (HCC) 05/29/2018   ? 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 Urinary tract infection 08/12/2015   ? Altered mental status 07/05/2010   ? Bipolar affective disorder (HCC) 07/05/2010   ? Essential hypertension 05/07/2009   ? 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 Multiple sclerosis (HCC) 05/07/2009   ? Frequent UTI 05/07/2009   ? Depression 05/07/2009   ? Unstable angina (HCC) 05/07/2009      Medications and Compliance: -states she has no idea what she takes or why  Carvedilol  Amlodipine  Potassium Chloride  Isosorbide Mononitrate  Hydrochlorithiazide  Hydralazine  Aldactone  Zoloft  Trazodone   Aspirin  Torsemide  Ativan  Gabapentin  Protonix  Ranolazine  Myrbetriq     A&O x4? x4  Eating/Drinking? Ind  Ambulating? Yes  Urinating? Incontinent of urine, unknown why  Skin concerns? None      Labs Completed? BUN 50  Creatinine 4.37  Alkaline Phosphatase 220  ETOH Level : <10  UA : not done   UDS : +BZO      ETA? TBD

## 2021-04-10 NOTE — Progress Notes
1510    Pt arrived to [facility] via wheelchair accompanied by intake staff.  She ambulates without assist with this Probation officer on a tour of the unit and then oriented to her room.  She voiced no questions or concerns at this time.  Encouraged Pt to inform staff with any questions or concerns.  Pt denies SI AVH HI.  Her mood is apathetic, she states she doesn't care if she live or dies.  "I don't want to kill myself but I don't care if I die either".  She states she has a history of AH but denies at this time.

## 2021-04-11 MED ADMIN — SERTRALINE 50 MG PO TAB [82509]: 50 mg | ORAL | @ 18:00:00 | Stop: 2021-04-11 | NDC 00904692561

## 2021-04-11 MED ADMIN — LORATADINE 10 MG PO TAB [10466]: 10 mg | ORAL | NDC 00904685261

## 2021-04-11 MED ADMIN — LORAZEPAM 1 MG PO TAB [4573]: 1 mg | ORAL | @ 18:00:00 | NDC 60687063811

## 2021-04-11 MED ADMIN — CHOLECALCIFEROL (VITAMIN D3) 25 MCG (1,000 UNIT) PO TAB [130074]: 2000 [IU] | ORAL | @ 23:00:00 | NDC 20555003300

## 2021-04-11 MED ADMIN — CLOPIDOGREL 75 MG PO TAB [78966]: 75 mg | ORAL | @ 23:00:00 | NDC 00904629461

## 2021-04-11 MED ADMIN — ISOSORBIDE MONONITRATE 30 MG PO TB24 [24521]: 30 mg | ORAL | NDC 00904644961

## 2021-04-11 NOTE — Progress Notes
Chart check completed.

## 2021-04-12 ENCOUNTER — Encounter: Admit: 2021-04-12 | Discharge: 2021-04-12 | Payer: MEDICARE | Primary: Family

## 2021-04-12 MED ADMIN — CLOPIDOGREL 75 MG PO TAB [78966]: 75 mg | ORAL | @ 14:00:00 | NDC 00904629461

## 2021-04-12 MED ADMIN — PANTOPRAZOLE 40 MG PO TBEC [80436]: 40 mg | ORAL | @ 01:00:00 | NDC 00904647461

## 2021-04-12 MED ADMIN — RANOLAZINE 500 MG PO TB12 [95539]: 500 mg | ORAL | @ 14:00:00 | NDC 60687054911

## 2021-04-12 MED ADMIN — RANOLAZINE 500 MG PO TB12 [95539]: 500 mg | ORAL | @ 01:00:00 | NDC 60687054911

## 2021-04-12 MED ADMIN — LORAZEPAM 1 MG PO TAB [4573]: 1 mg | ORAL | @ 14:00:00 | NDC 60687063811

## 2021-04-12 MED ADMIN — LORAZEPAM 1 MG PO TAB [4573]: 1 mg | ORAL | @ 01:00:00 | NDC 60687063811

## 2021-04-12 MED ADMIN — SULFAMETHOXAZOLE-TRIMETHOPRIM 800-160 MG PO TAB [7555]: 1 | ORAL | @ 20:00:00 | Stop: 2021-04-17 | NDC 60687053111

## 2021-04-12 MED ADMIN — GABAPENTIN 300 MG PO CAP [18308]: 300 mg | ORAL | @ 01:00:00 | NDC 00904666661

## 2021-04-12 MED ADMIN — ISOSORBIDE MONONITRATE 30 MG PO TB24 [24521]: 60 mg | ORAL | @ 14:00:00 | NDC 00904644961

## 2021-04-12 MED ADMIN — CHOLECALCIFEROL (VITAMIN D3) 25 MCG (1,000 UNIT) PO TAB [130074]: 2000 [IU] | ORAL | @ 14:00:00 | NDC 20555003300

## 2021-04-12 MED ADMIN — PRAZOSIN 1 MG PO CAP [6468]: 1 mg | ORAL | @ 01:00:00 | NDC 00904702061

## 2021-04-12 MED ADMIN — SERTRALINE 100 MG PO TAB [78401]: 100 mg | ORAL | @ 14:00:00 | NDC 60687025311

## 2021-04-12 MED ADMIN — LORATADINE 10 MG PO TAB [10466]: 10 mg | ORAL | @ 14:00:00 | NDC 00904685261

## 2021-04-13 MED ADMIN — GABAPENTIN 300 MG PO CAP [18308]: 300 mg | ORAL | @ 01:00:00 | NDC 00904666661

## 2021-04-13 MED ADMIN — LORATADINE 10 MG PO TAB [10466]: 10 mg | ORAL | @ 14:00:00 | NDC 00904685261

## 2021-04-13 MED ADMIN — ISOSORBIDE MONONITRATE 30 MG PO TB24 [24521]: 60 mg | ORAL | @ 14:00:00 | NDC 00904644961

## 2021-04-13 MED ADMIN — PRAZOSIN 1 MG PO CAP [6468]: 1 mg | ORAL | @ 01:00:00 | NDC 00904702061

## 2021-04-13 MED ADMIN — SULFAMETHOXAZOLE-TRIMETHOPRIM 800-160 MG PO TAB [7555]: 1 | ORAL | @ 14:00:00 | Stop: 2021-04-17 | NDC 60687053111

## 2021-04-13 MED ADMIN — RANOLAZINE 500 MG PO TB12 [95539]: 500 mg | ORAL | @ 01:00:00 | NDC 60687054911

## 2021-04-13 MED ADMIN — CLOPIDOGREL 75 MG PO TAB [78966]: 75 mg | ORAL | @ 14:00:00 | NDC 00904629461

## 2021-04-13 MED ADMIN — LORAZEPAM 1 MG PO TAB [4573]: 1 mg | ORAL | @ 01:00:00 | NDC 60687063811

## 2021-04-13 MED ADMIN — SENNOSIDES 8.6 MG PO TAB [11349]: 1 | ORAL | @ 20:00:00 | NDC 00904652261

## 2021-04-13 MED ADMIN — SERTRALINE 100 MG PO TAB [78401]: 100 mg | ORAL | @ 14:00:00 | NDC 00904692661

## 2021-04-13 MED ADMIN — PANTOPRAZOLE 40 MG PO TBEC [80436]: 40 mg | ORAL | @ 01:00:00 | NDC 00904647461

## 2021-04-13 MED ADMIN — CHOLECALCIFEROL (VITAMIN D3) 25 MCG (1,000 UNIT) PO TAB [130074]: 2000 [IU] | ORAL | @ 14:00:00 | NDC 20555003300

## 2021-04-13 MED ADMIN — LORAZEPAM 1 MG PO TAB [4573]: 1 mg | ORAL | @ 14:00:00 | NDC 60687063811

## 2021-04-13 MED ADMIN — RANOLAZINE 500 MG PO TB12 [95539]: 500 mg | ORAL | @ 14:00:00 | NDC 60687054911

## 2021-04-14 MED ADMIN — ISOSORBIDE MONONITRATE 30 MG PO TB24 [24521]: 60 mg | ORAL | @ 13:00:00 | NDC 00904644961

## 2021-04-14 MED ADMIN — PANTOPRAZOLE 40 MG PO TBEC [80436]: 40 mg | ORAL | @ 01:00:00 | NDC 00904647461

## 2021-04-14 MED ADMIN — RANOLAZINE 500 MG PO TB12 [95539]: 500 mg | ORAL | @ 13:00:00 | NDC 60687054911

## 2021-04-14 MED ADMIN — SENNOSIDES 8.6 MG PO TAB [11349]: 1 | ORAL | @ 01:00:00 | NDC 00904652261

## 2021-04-14 MED ADMIN — LORAZEPAM 1 MG PO TAB [4573]: 1 mg | ORAL | @ 13:00:00 | NDC 60687063811

## 2021-04-14 MED ADMIN — SERTRALINE 100 MG PO TAB [78401]: 100 mg | ORAL | @ 13:00:00 | NDC 60687025311

## 2021-04-14 MED ADMIN — LORATADINE 10 MG PO TAB [10466]: 10 mg | ORAL | @ 13:00:00 | NDC 00904685261

## 2021-04-14 MED ADMIN — LORAZEPAM 1 MG PO TAB [4573]: 1 mg | ORAL | @ 01:00:00 | NDC 60687063811

## 2021-04-14 MED ADMIN — PRAZOSIN 1 MG PO CAP [6468]: 1 mg | ORAL | @ 01:00:00 | NDC 00904702061

## 2021-04-14 MED ADMIN — CHOLECALCIFEROL (VITAMIN D3) 25 MCG (1,000 UNIT) PO TAB [130074]: 2000 [IU] | ORAL | @ 13:00:00 | NDC 20555003300

## 2021-04-14 MED ADMIN — GABAPENTIN 300 MG PO CAP [18308]: 300 mg | ORAL | @ 13:00:00 | NDC 00904666661

## 2021-04-14 MED ADMIN — CLOPIDOGREL 75 MG PO TAB [78966]: 75 mg | ORAL | @ 13:00:00 | NDC 00904629461

## 2021-04-14 MED ADMIN — RANOLAZINE 500 MG PO TB12 [95539]: 500 mg | ORAL | @ 01:00:00 | NDC 60687054911

## 2021-04-14 MED ADMIN — SENNOSIDES 8.6 MG PO TAB [11349]: 1 | ORAL | @ 13:00:00 | NDC 00904652261

## 2021-04-14 MED ADMIN — SULFAMETHOXAZOLE-TRIMETHOPRIM 800-160 MG PO TAB [7555]: 1 | ORAL | @ 13:00:00 | Stop: 2021-04-17 | NDC 60687053111

## 2021-04-14 MED ADMIN — GABAPENTIN 300 MG PO CAP [18308]: 300 mg | ORAL | @ 01:00:00 | NDC 00904666661

## 2021-04-15 ENCOUNTER — Encounter: Admit: 2021-04-15 | Discharge: 2021-04-15 | Payer: MEDICARE | Primary: Family

## 2021-04-15 MED ADMIN — RANOLAZINE 500 MG PO TB12 [95539]: 500 mg | ORAL | @ 02:00:00 | NDC 60687054911

## 2021-04-15 MED ADMIN — ISOSORBIDE MONONITRATE 30 MG PO TB24 [24521]: 60 mg | ORAL | @ 14:00:00 | Stop: 2021-04-16 | NDC 00904644961

## 2021-04-15 MED ADMIN — SERTRALINE 100 MG PO TAB [78401]: 100 mg | ORAL | @ 14:00:00 | Stop: 2021-04-16 | NDC 00904692661

## 2021-04-15 MED ADMIN — LORAZEPAM 1 MG PO TAB [4573]: 1 mg | ORAL | @ 14:00:00 | Stop: 2021-04-16 | NDC 60687063811

## 2021-04-15 MED ADMIN — LORATADINE 10 MG PO TAB [10466]: 10 mg | ORAL | @ 14:00:00 | Stop: 2021-04-16 | NDC 00904685261

## 2021-04-15 MED ADMIN — LORAZEPAM 1 MG PO TAB [4573]: 1 mg | ORAL | @ 02:00:00 | NDC 60687063811

## 2021-04-15 MED ADMIN — GABAPENTIN 300 MG PO CAP [18308]: 300 mg | ORAL | @ 14:00:00 | Stop: 2021-04-16 | NDC 00904666661

## 2021-04-15 MED ADMIN — CHOLECALCIFEROL (VITAMIN D3) 25 MCG (1,000 UNIT) PO TAB [130074]: 2000 [IU] | ORAL | @ 14:00:00 | Stop: 2021-04-16 | NDC 20555003300

## 2021-04-15 MED ADMIN — SULFAMETHOXAZOLE-TRIMETHOPRIM 800-160 MG PO TAB [7555]: 1 | ORAL | @ 14:00:00 | Stop: 2021-04-16 | NDC 60687053111

## 2021-04-15 MED ADMIN — PANTOPRAZOLE 40 MG PO TBEC [80436]: 40 mg | ORAL | @ 02:00:00 | NDC 00904647461

## 2021-04-15 MED ADMIN — PRAZOSIN 1 MG PO CAP [6468]: 1 mg | ORAL | @ 02:00:00 | NDC 00904702061

## 2021-04-15 MED ADMIN — RANOLAZINE 500 MG PO TB12 [95539]: 500 mg | ORAL | @ 14:00:00 | Stop: 2021-04-16 | NDC 60687054911

## 2021-04-15 MED ADMIN — CLOPIDOGREL 75 MG PO TAB [78966]: 75 mg | ORAL | @ 14:00:00 | Stop: 2021-04-16 | NDC 00904629461

## 2021-04-15 MED ADMIN — GABAPENTIN 300 MG PO CAP [18308]: 300 mg | ORAL | @ 02:00:00 | NDC 00904666661

## 2021-04-15 MED FILL — SULFAMETHOXAZOLE-TRIMETHOPRIM 800-160 MG PO TAB: 160/800 mg | ORAL | 1 days supply | Qty: 1 | Fill #1 | Status: CP

## 2021-05-20 ENCOUNTER — Encounter: Admit: 2021-05-20 | Discharge: 2021-05-20 | Payer: MEDICARE | Primary: Family

## 2021-05-21 ENCOUNTER — Encounter: Admit: 2021-05-21 | Discharge: 2021-05-21 | Payer: MEDICARE | Primary: Family

## 2021-05-21 DIAGNOSIS — G35 Multiple sclerosis: Secondary | ICD-10-CM

## 2021-05-21 DIAGNOSIS — R2990 Unspecified symptoms and signs involving the nervous system: Secondary | ICD-10-CM

## 2021-05-21 NOTE — Progress Notes
MRI Brain and MRI C-spine orders faxed to [facility] in [place], [place] at [phone]. Received fax confirmation at 10:55am.

## 2021-05-25 ENCOUNTER — Encounter: Admit: 2021-05-25 | Discharge: 2021-05-25 | Payer: MEDICARE | Primary: Family

## 2021-06-01 ENCOUNTER — Encounter: Admit: 2021-06-01 | Discharge: 2021-06-01 | Payer: MEDICARE | Primary: Family

## 2021-06-03 ENCOUNTER — Encounter: Admit: 2021-06-03 | Discharge: 2021-06-03 | Payer: MEDICARE | Primary: Family

## 2021-06-03 ENCOUNTER — Ambulatory Visit: Admit: 2021-06-03 | Discharge: 2021-06-03 | Payer: MEDICARE | Primary: Family

## 2021-06-03 DIAGNOSIS — I6783 Posterior reversible encephalopathy syndrome: Secondary | ICD-10-CM

## 2021-06-03 DIAGNOSIS — R200 Anesthesia of skin: Secondary | ICD-10-CM

## 2021-06-03 DIAGNOSIS — K5792 Diverticulitis of intestine, part unspecified, without perforation or abscess without bleeding: Secondary | ICD-10-CM

## 2021-06-03 DIAGNOSIS — N390 Urinary tract infection, site not specified: Secondary | ICD-10-CM

## 2021-06-03 DIAGNOSIS — G35 Multiple sclerosis: Secondary | ICD-10-CM

## 2021-06-03 DIAGNOSIS — E785 Hyperlipidemia, unspecified: Secondary | ICD-10-CM

## 2021-06-03 DIAGNOSIS — R918 Other nonspecific abnormal finding of lung field: Secondary | ICD-10-CM

## 2021-06-03 DIAGNOSIS — I10 Essential (primary) hypertension: Secondary | ICD-10-CM

## 2021-06-03 DIAGNOSIS — F32A Depression: Secondary | ICD-10-CM

## 2021-06-03 DIAGNOSIS — I701 Atherosclerosis of renal artery: Secondary | ICD-10-CM

## 2021-06-03 DIAGNOSIS — T50905A Adverse effect of unspecified drugs, medicaments and biological substances, initial encounter: Secondary | ICD-10-CM

## 2021-06-03 DIAGNOSIS — F319 Bipolar disorder, unspecified: Secondary | ICD-10-CM

## 2021-06-03 LAB — FOLATE, SERUM: SERUM FOLATE: 17 ng/mL (ref >3.9)

## 2021-06-03 LAB — SYPHILIS AB SCREEN: SYPHILIS AB, TOTAL: NEGATIVE

## 2021-06-03 LAB — VITAMIN B12: VITAMIN B12: 427 pg/mL (ref 180–914)

## 2021-06-03 NOTE — Progress Notes
Date of Service: 06/03/2021    Subjective:             Debra Hall is a 50 y.o. female.    History of Present Illness  We were unable to reach her after her last visit after several attempts. We had received records of her hospitalization; there was no mention of increased MS symptoms, nor MRIs done. She has felt weaker lately. Vision is worse. Notes that with prolonged starring or looking quickly to a different direction her vision is blurred. This improves quickly. Dropping things. Cognition is worse. She admits being under a lot of stress. She has had an inpatient psychiatric stay here in the interim at [facility] passive suicidal ideation after recent release from [facility] for similar clinical presentation, also treated for recurrent UTI.  ?     Review of Systems   Constitutional: Positive for activity change, appetite change, chills, diaphoresis and fatigue.   HENT: Positive for mouth sores, sinus pressure, sinus pain and trouble swallowing.    Eyes: Positive for photophobia, pain, discharge, redness, itching and visual disturbance.   Respiratory: Positive for chest tightness and shortness of breath.    Cardiovascular: Positive for chest pain.   Gastrointestinal: Positive for diarrhea.   Musculoskeletal: Positive for arthralgias, back pain and myalgias.   Neurological: Positive for dizziness, weakness, light-headedness, numbness and headaches.   Psychiatric/Behavioral: Positive for decreased concentration and sleep disturbance. The patient is nervous/anxious.    All other systems reviewed and are negative.        Objective:         ? albuterol sulfate (PROAIR HFA) 90 mcg/actuation HFA aerosol inhaler Inhale 2 puffs by mouth into the lungs every 6 hours as needed for Wheezing or Shortness of Breath. Shake well before use.   ? amLODIPine (NORVASC) 10 mg tablet TAKE ONE TABLET BY MOUTH EVERY DAY   ? aspirin EC 81 mg tablet Take one tablet by mouth daily. Take with food.   ? atorvastatin (LIPITOR) 40 mg tablet Take one tablet by mouth at bedtime daily.   ? carvediloL (COREG) 25 mg tablet TAKE ONE TABLET BY MOUTH TWICE DAILY WITH FOOD   ? cholecalciferol (vitamin D3) (VITAMIN D3) 1,000 units tablet Take 2,000 Units by mouth daily.   ? clopiDOGrel (PLAVIX) 75 mg tablet Take 75 mg by mouth daily.   ? EPINEPHrine (EPIPEN) 1 mg/mL injection pen (2-Pack) Inject 0.3 mg into the muscle once as needed. Inject 0.3 mg (1 Pen) into thigh if needed for anaphylactic reaction. May repeat in 5-15 minutes if needed.   ? gabapentin (NEURONTIN) 300 mg capsule Take one capsule by mouth twice daily.   ? hydrALAZINE (APRESOLINE) 100 mg tablet TAKE ONE TABLET BY MOUTH THREE TIMES DAILY   ? isosorbide mononitrate ER (IMDUR) 60 mg tablet TAKE ONE TABLET BY MOUTH EVERY MORNING   ? loratadine (CLARITIN) 10 mg tablet Take one tablet by mouth daily.   ? LORazepam (ATIVAN) 1 mg tablet Take 1 mg by mouth twice daily as needed (anxiety).   ? mirabegron [facility] ER) 25 mg tablet Take 25 mg by mouth daily.   ? multivit with calcium,iron,min [facility] DAILY MULTIVITAMIN PO) Take 1 tablet by mouth daily.   ? nitroglycerin (NITROSTAT) 0.4 mg tablet DISSOLVE ONE TABLET UNDER TONGUE EVERY FIVE MINUTES AS NEEDED FOR CHEST PAIN. *DO NOT EXCEED A TOTAL OF THREE DOSES IN 15 MINUTES. CALL 911 IF CHEST PAIN PERSISTS AFTER THREE DOSES*   ? pantoprazole DR (PROTONIX) 40  mg tablet Take 40 mg by mouth daily.   ? potassium chloride (KLOR-CON SPRINKLE) 10 mEq capsule TAKE THREE CAPSULES BY MOUTH TWICE DAILY with torsemide   ? prazosin (MINIPRESS) 1 mg capsule Take one capsule by mouth at bedtime daily.   ? ranolazine ER (RANEXA) 500 mg tablet Take 500 mg by mouth twice daily.   ? senna (SENOKOT) 8.6 mg tablet Take one tablet by mouth twice daily. (Patient taking differently: Take 1 tablet by mouth as Needed.)   ? sertraline (ZOLOFT) 100 mg tablet Take one tablet by mouth daily.   ? sodium bicarbonate 650 mg tablet Take 650 mg by mouth twice daily.   ? spironolactone (ALDACTONE) 25 mg tablet Take 25 mg by mouth daily. Take with food.   ? torsemide (DEMADEX) 20 mg tablet Take two tablets by mouth twice daily.   ? traZODone (DESYREL) 50 mg tablet Take 50 mg by mouth at bedtime as needed for Sleep.     Vitals:    06/03/21 0943   BP: (!) 141/88   BP Source: Arm, Left Upper   Pulse: 76   PainSc: Zero   Weight: 83.9 kg (185 lb)   Height: 170.2 cm (5' 7.01)     Body mass index is 28.97 kg/m?Marland Kitchen     Physical Exam  Depression Screening was performed on [facility] in clinic today. Based on the score of  , no follow up action or recommendations are necessary at this time.    Physical Exam:  HEENT: normal  Cardiovascular: no lower extremity edema    Neurological exam:  Mental Status Exam: Alert oriented to time, person and place. Normal processing speed.  Speech and Language: Intact for fluency, reception and repetition     Cranial Nerve Exam:    Cranial Nerves III-XII Right Left   III, IV, VI (EOM's) normal normal   V decrease normal   VII normal normal   VIII normal normal   IX, X normal normal   XI normal normal   XII normal normal     Nystagmus: none    Motor:    R L   R L   Neck flexors    Hip flexors 5 5   Neck extensors         Shoulder Abductors 5 5  Hip extensors     Elbow flexors 5 5       Elbow extensors 5 5  Knee flexors 5 5   Wrist flexors 5 5  Knee extensors 5 5   Wrist extensors 5 5  Ankle dorsiflexors 5 5   Finger flexors 5 5  Ankle plantar flexors 5 5   Finger abductors 5 5       Fingers extensors 5 5         Giveaway throughout, best effort with encouragement 5/5    Bulk and Tone:    Upper Extremity R L   Atrophy none none   Increased Tone 0/4 0/4     Lower Extremity R L   Atrophy none none   Increased Tone 0/4 0/4     Reflexes: 3+ throughout, Hoffman present bilaterally, toes downgoing    Sensory Examination Light Touch:  R L   Upper Extremity decreased 50% intact   Lower Extremity decreased 50% intact     Proprioception: 100% incorrect in her toes  Pinprick: decreased at least up to knee    Cerebellar/Fine Motor R L   Finger nose finger no dysmetria no dysmetria  Heel to shin no dysmetria no dysmetria   Rapid alternating movements normal speed, no ataxia normal speed, no ataxia   Mild asterixis    Gait: normal based cautious gait  Romberg: absent       Assessment and Plan:    Problem   History of Multiple Sclerosis (Hcc)        History of multiple sclerosis (HCC)  After her visit, we received records from ophthalmologist locally. 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 There were no findings consistent with MS ocular findings. We were unable to corroborate her history of recent MS relapse treated with steroids while hospitalized; there were no MRIs done locally in the timeframe and location she had done them locally, either. She was able to complete one of two MRIs we recommended (MRI brain not c-spine locally).     MRI brain reveals stable T2 hyperintensities in a pattern suggestive of microvascular ischemic disease. This is also supported by microhemorrhages consistent with hypertensive encephalopathy. These findings are also congruent with her known vascular risk factors. I don't find her MRI results compelling for MS. Findings are stable from study in 01/2019, which would be inconsistent with MS relapse in the same timeframe, as I would expect to see new MS lesions consistent with relapse.    She was disappointed to hear that I doubted her prior diagnosis of MS and didn't suggest she resume MS treatment. Her clinical history and MRI are not consistent with MS.    Her exam shows features of at least a functional overlay (give-away on motor strength exam, 100% incorrect guesses on proprioception exam). Therefore, ultimately this is hard to interpret. She has numerous reasons for her different symptoms, including renal disease (related asterixis), cardiovascular disease, polypharmacy, mental health factors. Would suggest evaluation for peripheral neuropathy, especially those that can cause myeloneuropathy. She also would be encouraged to complete MRI c-spine. She is also encouraged to continue to work with physical therapy.      At this time I am discharging her from MS clinic.  I provided her with contact information should she have any questions in the future.     Demetrio Lapping, DO  Assistant Professor  Neuroimmunology Division       Total Time Today was 51 minutes in the following activities: Preparing to see the patient, Performing a medically appropriate examination and/or evaluation, Counseling and educating the patient/family/caregiver, Ordering medications, tests, or procedures and Documenting clinical information in the electronic or other health record

## 2021-06-04 ENCOUNTER — Encounter: Admit: 2021-06-04 | Discharge: 2021-06-04 | Payer: MEDICARE | Primary: Family

## 2021-06-04 NOTE — Telephone Encounter
Robin at [facility] in [place], [place] called stating pt is scheduled to have an MRI on Monday 06/07/21 however pt has been in and out of their ER so they wanted to clarify if we want to do an MRI Head and C-spine or a C-spine and T-spine.     Ph. [phone]    Notified Robin at [facility] that we will proceed with MRI C-spine only per Dr. Drucie Opitz 06/03/21 OV note. Pt had MRI Head done on 05/25/21.

## 2021-06-09 ENCOUNTER — Encounter: Admit: 2021-06-09 | Discharge: 2021-06-09 | Payer: MEDICARE | Primary: Family

## 2021-06-28 ENCOUNTER — Encounter: Admit: 2021-06-28 | Discharge: 2021-06-28 | Payer: MEDICARE | Primary: Family

## 2021-06-28 MED ORDER — AMLODIPINE 10 MG PO TAB
10 mg | ORAL_TABLET | Freq: Every day | ORAL | 1 refills | Status: AC
Start: 2021-06-28 — End: ?

## 2021-07-19 ENCOUNTER — Encounter: Admit: 2021-07-19 | Discharge: 2021-07-19 | Payer: MEDICARE | Primary: Family

## 2021-07-20 ENCOUNTER — Encounter: Admit: 2021-07-20 | Discharge: 2021-07-20 | Payer: MEDICARE | Primary: Family

## 2021-07-20 DIAGNOSIS — K5792 Diverticulitis of intestine, part unspecified, without perforation or abscess without bleeding: Secondary | ICD-10-CM

## 2021-07-20 DIAGNOSIS — E785 Hyperlipidemia, unspecified: Secondary | ICD-10-CM

## 2021-07-20 DIAGNOSIS — F32A Depression: Secondary | ICD-10-CM

## 2021-07-20 DIAGNOSIS — G35 Multiple sclerosis: Secondary | ICD-10-CM

## 2021-07-20 DIAGNOSIS — I10 Essential (primary) hypertension: Secondary | ICD-10-CM

## 2021-07-20 DIAGNOSIS — I701 Atherosclerosis of renal artery: Secondary | ICD-10-CM

## 2021-07-20 DIAGNOSIS — I25118 Atherosclerotic heart disease of native coronary artery with other forms of angina pectoris: Secondary | ICD-10-CM

## 2021-07-20 DIAGNOSIS — I5032 Chronic diastolic (congestive) heart failure: Secondary | ICD-10-CM

## 2021-07-20 DIAGNOSIS — R918 Other nonspecific abnormal finding of lung field: Secondary | ICD-10-CM

## 2021-07-20 DIAGNOSIS — T50905A Adverse effect of unspecified drugs, medicaments and biological substances, initial encounter: Secondary | ICD-10-CM

## 2021-07-20 DIAGNOSIS — N390 Urinary tract infection, site not specified: Secondary | ICD-10-CM

## 2021-07-20 DIAGNOSIS — F319 Bipolar disorder, unspecified: Secondary | ICD-10-CM

## 2021-07-20 MED ORDER — HYDRALAZINE 25 MG PO TAB
25 mg | ORAL_TABLET | Freq: Three times a day (TID) | ORAL | 3 refills | 42.50000 days | Status: AC
Start: 2021-07-20 — End: ?

## 2021-07-20 NOTE — Patient Instructions
Thank you for visiting our office today.    We would like to make the following medication adjustments:    Start taking hydralizine 25 mg three times daily for blood pressure.       Otherwise continue the same medications as you have been doing.          We will be pursuing the following tests after your appointment today:     Dr. Erling Cruz would like for you to have an echo in about 6 months before next office visit.   Orders Placed This Encounter    2D + DOPPLER ECHO    hydrALAZINE (APRESOLINE) 25 mg tablet         We will plan to see you back in 6 months.  Please call us in the meantime with any questions or concerns.        Please allow 5-7 business days for our providers to review your results. All normal results will go to MyChart. If you do not have Mychart, it is strongly recommended to get this so you can easily view all your results. If you do not have mychart, we will attempt to call you once with normal lab and testing results. If we cannot reach you by phone with normal results, we will send you a letter.  If you have not heard the results of your testing after one week please give [place] a call.       Your Cardiovascular Medicine [place] Team Richardson Landry, Rene Kocher and [place])  phone number is [phone].

## 2021-07-23 ENCOUNTER — Encounter: Admit: 2021-07-23 | Discharge: 2021-07-23 | Payer: MEDICARE | Primary: Family

## 2021-07-23 MED ORDER — AMLODIPINE 10 MG PO TAB
10 mg | ORAL_TABLET | Freq: Every day | ORAL | 3 refills | Status: AC
Start: 2021-07-23 — End: ?

## 2021-10-05 ENCOUNTER — Encounter: Admit: 2021-10-05 | Discharge: 2021-10-05 | Payer: MEDICARE | Primary: Family

## 2021-11-18 ENCOUNTER — Encounter: Admit: 2021-11-18 | Discharge: 2021-11-18 | Payer: MEDICARE | Primary: Family

## 2021-11-19 NOTE — Progress Notes
51 yr old female  PMH: HF, ischemic cardiomyopathy,CAD, CABG, HTN, ESRD, MS, bipolar, schizophrenia, hx of hypertensive emergency  ER with c/o head/neck/back pain, dizziness  Initial BP 248/165  207/130 after 20mg  labetalol  Starting Nitro gtt now  Has had minimal chest discomfort x3-4 days  Troponin 0.076  EKG - no ischemic changes  CXR unremarkable  CT head pending  Neuro intact  K 3.2  Cr 3.91- baseline  Meds: fluids, valium  223/149, 86, 14, 98% RA, afebrile, A&O

## 2021-11-23 ENCOUNTER — Encounter: Admit: 2021-11-23 | Discharge: 2021-11-23 | Payer: MEDICARE | Primary: Family

## 2021-11-23 MED ORDER — ISOSORBIDE MONONITRATE 60 MG PO TB24
60 mg | ORAL_TABLET | Freq: Every morning | ORAL | 3 refills | 90.00000 days | Status: AC
Start: 2021-11-23 — End: ?

## 2021-12-09 ENCOUNTER — Encounter: Admit: 2021-12-09 | Discharge: 2021-12-09 | Payer: MEDICARE | Primary: Family

## 2021-12-09 NOTE — Telephone Encounter
TRANSPLANT AUTHORIZATIONS     PLAN: [employer] AUTHORIZATION Phase 1 Evaluation  Auth extension #:[id number] eff: 12/07/2021 - 12/06/2022  NCM: Janett Billow D.  Phone #: [phone]  FAX #: [phone]  Comments/request: Global authorization- All testing and high tech radiology is covered under this auth approval

## 2022-01-17 ENCOUNTER — Encounter: Admit: 2022-01-17 | Discharge: 2022-01-17 | Payer: MEDICARE | Primary: Family

## 2022-02-18 ENCOUNTER — Encounter: Admit: 2022-02-18 | Discharge: 2022-02-18 | Payer: MEDICARE | Primary: Family

## 2022-03-02 ENCOUNTER — Encounter: Admit: 2022-03-02 | Discharge: 2022-03-02 | Payer: MEDICARE | Primary: Family

## 2022-03-02 NOTE — Telephone Encounter
pt calling to ask what she needs to do to complete the evaluation process , lmsg w/ NC Mickel Baas

## 2022-03-03 ENCOUNTER — Encounter: Admit: 2022-03-03 | Discharge: 2022-03-03 | Payer: MEDICARE | Primary: Family

## 2022-03-03 NOTE — Telephone Encounter
Received VM from pt that she would like to know if she has completed her eval since she will be starting dialysis soon.   Noted that have not seen pt since May 2022  1 year, pt had inpatient psych stay in Aug 2022, is not sched to see psych until Nov after previous no show and canceled appts. Do not see appt with transplant psych Dr. Levonne Spiller sched.  Noted that cardiac testing was completed and pt last saw Dr. Erling Cruz in Dec 2022, was to repeat echo in 6 mos, may be seeing outside cardiology.   Does not appear that she ever saw [facility]. Unsure about cancer screenings.  Attempted to contact pt @ number left in VM [phone], unable to leave VM as VMbox full.

## 2022-03-08 ENCOUNTER — Ambulatory Visit: Admit: 2022-03-08 | Discharge: 2022-03-08 | Payer: MEDICARE | Primary: Family

## 2022-03-08 ENCOUNTER — Encounter: Admit: 2022-03-08 | Discharge: 2022-03-08 | Payer: MEDICARE | Primary: Family

## 2022-03-08 DIAGNOSIS — I509 Heart failure, unspecified: Secondary | ICD-10-CM

## 2022-03-08 DIAGNOSIS — I10 Essential (primary) hypertension: Secondary | ICD-10-CM

## 2022-03-08 DIAGNOSIS — I25119 Atherosclerotic heart disease of native coronary artery with unspecified angina pectoris: Secondary | ICD-10-CM

## 2022-03-08 NOTE — Telephone Encounter
Received VM from pt again asking re: her transplant waitlist status and if she is active or not since she is about to start dialysis.   Attempted to contact pt @ number left in VM, left detailed VM.

## 2022-03-18 ENCOUNTER — Encounter: Admit: 2022-03-18 | Discharge: 2022-03-18 | Payer: MEDICARE | Primary: Family

## 2022-03-18 NOTE — Telephone Encounter
LVM with patient: Per 01/01/21 Deferral Letter you'll need to complete those action items listed to be rescheduled to see the Txp Team. Per chart review--    1.  Colonoscopy (please let your coordinator know if you have completed this test  since turning 44- if not you will need to arrange this test through your PCP and let your coordinator know when scheduled)  2015 scan noted with Serrated polyp- needs to be repeated  2. Mammogram (please let your coordinator know if you have completed this test in the last year- if not you will need to arrange this test through your PCP or OBGYN and let your coordinator know when scheduled)- was due 09/2021- needs to be repeated  3. PAP (please let your coordinator know if you have completed this test in the last 3 years-  if not you will need to arrange this test through your PCP or OBGYN and let your coordinator know when scheduled) 07/31/18 pap noted- needs to be repeated  4. Echocardiogram (someone will call you to schedule this test)- completed 03/08/22- needs to be reviewed by Primary Transplant Team (MD/RN)  5. COVID-19 vaccination (you will need to complete 3 doses of COVID-19 mRNA  vaccination and 4th booster is recommended)- received Bivalent 06/25/21  6. Psych evaluation (please call [phone] to schedule this)- scheduled for 07/06/22 at [place]  7. Off of Plavix (please talk to the doctor prescribing Plavix about transitioning off of this Medication)- no update on this medication  8.  Pulmonary consult (someone will call you to schedule this)- Pulmonary Referral Closed in 2022, seeing Pulm outside [place]?  9.  Physical therapy consult (please call [phone] to schedule this)- PT Referral Closed in 2022, seeing/completed PT outside [place]?    Notified her she will not be scheduled to be seen until these items are completed.     Offered to resend this via Pharmacist, community.    Routed to Primary NC

## 2022-03-30 ENCOUNTER — Encounter: Admit: 2022-03-30 | Discharge: 2022-03-30 | Payer: MEDICARE | Primary: Family

## 2022-03-30 MED ORDER — CARVEDILOL 25 MG PO TAB
ORAL_TABLET | ORAL | 3 refills | 90.00000 days | Status: AC
Start: 2022-03-30 — End: ?
  Filled 2022-05-13: qty 180, 90d supply, fill #1

## 2022-03-31 ENCOUNTER — Encounter: Admit: 2022-03-31 | Discharge: 2022-03-31 | Payer: MEDICARE | Primary: Family

## 2022-03-31 NOTE — Telephone Encounter
Pt called in and asked what needed to be completed before she can schedule an appointment. I advised Pt Per your list. Pt said that she will have those things completed and will have results faxed to our office once completed. No call back was requested at this time.

## 2022-04-01 ENCOUNTER — Encounter: Admit: 2022-04-01 | Discharge: 2022-04-01 | Payer: MEDICARE | Primary: Family

## 2022-04-05 ENCOUNTER — Encounter: Admit: 2022-04-05 | Discharge: 2022-04-05 | Payer: MEDICARE | Primary: Family

## 2022-04-14 ENCOUNTER — Encounter: Admit: 2022-04-14 | Discharge: 2022-04-14 | Payer: MEDICARE | Primary: Family

## 2022-04-14 DIAGNOSIS — I5032 Chronic diastolic (congestive) heart failure: Secondary | ICD-10-CM

## 2022-04-15 ENCOUNTER — Encounter: Admit: 2022-04-15 | Discharge: 2022-04-15 | Payer: MEDICARE | Primary: Family

## 2022-04-15 ENCOUNTER — Ambulatory Visit: Admit: 2022-04-15 | Discharge: 2022-04-15 | Payer: MEDICARE | Primary: Family

## 2022-04-15 DIAGNOSIS — I509 Heart failure, unspecified: Secondary | ICD-10-CM

## 2022-04-22 ENCOUNTER — Ambulatory Visit: Admit: 2022-04-22 | Discharge: 2022-04-23 | Payer: MEDICARE | Primary: Family

## 2022-04-22 ENCOUNTER — Encounter: Admit: 2022-04-22 | Discharge: 2022-04-22 | Payer: MEDICARE | Primary: Family

## 2022-04-22 DIAGNOSIS — Z136 Encounter for screening for cardiovascular disorders: Secondary | ICD-10-CM

## 2022-04-22 DIAGNOSIS — Z01810 Encounter for preprocedural cardiovascular examination: Secondary | ICD-10-CM

## 2022-04-22 DIAGNOSIS — I5032 Chronic diastolic (congestive) heart failure: Secondary | ICD-10-CM

## 2022-04-22 DIAGNOSIS — Z01818 Encounter for other preprocedural examination: Secondary | ICD-10-CM

## 2022-04-22 DIAGNOSIS — I25118 Atherosclerotic heart disease of native coronary artery with other forms of angina pectoris: Secondary | ICD-10-CM

## 2022-04-22 DIAGNOSIS — I255 Ischemic cardiomyopathy: Secondary | ICD-10-CM

## 2022-04-22 DIAGNOSIS — Z951 Presence of aortocoronary bypass graft: Secondary | ICD-10-CM

## 2022-04-22 DIAGNOSIS — I701 Atherosclerosis of renal artery: Secondary | ICD-10-CM

## 2022-04-22 MED ORDER — HYDRALAZINE 50 MG PO TAB
ORAL_TABLET | ORAL | 0 refills | 42.50000 days | Status: AC
Start: 2022-04-22 — End: ?

## 2022-04-22 NOTE — Progress Notes
Date of Service: 04/22/2022    Debra Hall is a 51 y.o. female.       HPI     We had the pleasure of seeing Debra Hall today in [facility] cardiovascular medicine clinic at [facility] for a follow-up appointment. She is an established patient of Debra Hall and was last seen in clinic in December 2022. ?She is here today for a preoperative cardiovascular risk stratification prior to renal transplantation.  Unfortunately she was ~ 30 minutes late to her appointment today.    [name] is a delightful 51 year old lady with history of hypertension, HFpEF, CKD 5 and CAD.  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 ?She has progressive renal disease and now stage 5 chronic kidney disease and is being considered for renal transplantation.     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     She had admission in July in [facility] for volume overload.  She for was finally started on hemodialysis twice weekly starting 2 weeks ago.  She is struggling with low blood pressure on days of dialysis with values as low as 92/48 mmHg.  On the other days her blood pressure runs as high as 220 over 110 mmHg.    From a cardiovascular symptom standpoint, she continues to report exertional chest pain and dyspnea with strenuous physical activity. ?She has stable chronic angina, which is overall unchanged. ?She is not having significant shortness of breath on exertion at her usual daily level of activity.  Her symptoms are consistent with NYHA class II. ?No lightheadedness or dizziness. ?No orthopnea, paroxysmal nocturnal dyspnea, or lower extremity swelling.     She continues to receive dual antiplatelet therapy.  She has been receiving this medication for several years now.  Review of notes suggest initiation of dual antiplatelet therapy after her cutaneous renal artery intervention and previous records from Dr. Pierre Bali from 2020 suggest discontinuation after 6 months.    Most recent echocardiogram from 04/15/2022 shows normal LV size with moderate concentric LVH and normal global LV systolic function with estimated EF of 60% and without segmental WMA. ?There is severe (grade III) LV diastolic dysfunction with elevated LAP. ?The right ventricle is normal in size and contractility. ?Left atrium was moderately dilated. ?There were no hemodynamically significant valvular abnormalities and normal central venous pressure with no evidence of pericardial effusion. ?Estimated PASP was 41 mmHg.     Most recent ischemic evaluation was with a ADAC multi gated thallium regadenoson MPI stress test on 10/19/2020 which was interpreted as being normal with no evidence of significant myocardial ischemia and with normal LV contractility and wall motion with an estimated EF of 74%. ?The study was deemed low risk in regards to predicted annual cardiovascular mortality rate.     ECG today shows normal sinus rhythm with LAE and old septal infarct.  This is unchanged when compared with prior tracing from  2020.    Most recent labs from July 2023 shows anemia with a hemoglobin of 8.7 g/dL, creatinine of 2.84 and BUN of 49 with an estimated EGFR of 80.  Sodium was 139, potassium 3.4, chloride 110, and CO2 of 19 with an anion gap of 10.  Most recent iron panel from June 2023 shows iron of 58 and ferritin 255.  She had minimal elevation in high-sensitivity troponin as recently as in April 2023.       Vitals:    04/22/22 0928   BP: (!) 189/115   BP Source: Arm, Right Upper   Pulse: 88   SpO2: 100%   PainSc: Zero   Weight: 68 kg (150 lb)   Height: 162.6 cm (5' 4)     Body mass index is 25.75 kg/m?Marland Kitchen     Past Medical History  Patient Active Problem List    Diagnosis Date Noted   ? 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 Right pulmonary lesion 04/14/2022   ? Pneumonia due to COVID-19 virus 04/14/2022   ? Peripheral neuropathy 04/14/2022   ? Elevated troponin 04/14/2022   ? Elevated brain natriuretic peptide (BNP) level 04/14/2022   ? 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 Multiple sclerosis (HCC) 04/01/2021   ? 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 Coronary artery disease of native artery of native heart with stable angina pectoris (HCC) 09/23/2019   ? Chronic heart failure with preserved ejection fraction (HFpEF) (HCC) 09/23/2019     03/08/22 ECHO  The left ventricular systolic function is normal. The visually estimated ejection fraction is 60%. Mild concentric hypertrophy. Normal left ventricular diastolic function. Normal left atrial pressure. The right ventricular size, wall thickness and systolic function are normal. There is mild mitral annular calcification without stenosis. Trace mitral and tricuspid valve regurgitation. Estimated Peak Systolic PA Pressure 17 mmHg.    04/15/22 ECHO  Left Ventricle The left ventricular size is normal. Wall thickness is increased. Moderate concentric hypertrophy. The left ventricular systolic function is normal. The visually estimated ejection fraction is 60%. There are no segmental wall motion abnormalities. Grade III (severe) left ventricular diastolic dysfunction. Elevated left atrial pressure. Right Ventricle: The right ventricular size, wall thickness and systolic function are normal. Left Atrium: Moderately dilated. Right Atrium: Normal size. IVC/SVC: Normal central venous pressure (0-5 mm Hg). Mitral Valve: Normal valve structure and non-specific thickening. Mild regurgitation. Tricuspid Valve: Normal valve structure. No stenosis. Mild regurgitation. Aortic Valve: Normal valve structure. No stenosis. No regurgitation. Pulmonary: The pulmonic valve was not seen well but no Doppler evidence of stenosis. Aorta: The aortic root and ascending aorta are normal in size. Pericardium: No pericardial effusion.         ? Ischemic cardiomyopathy 08/12/2018   ? Volume overload 06/29/2018   ? 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 Acute blood loss anemia 06/12/2018   ? Hyperkalemia 06/12/2018   ? 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 CAD (coronary artery disease), native coronary artery 06/07/2018     06/04/18: Admitted with type 2 NSTEMI due to hypertensive emergency    06/05/18: Cath - 20-30% left main, 40-50% prox-LAD, 70% mid-LAD, 70% mid-to-distal LAD, 80% ostial 1st diag, 80% prox-OM1, 70% OM3, 30-40% prox-to-mid CFX, 70% DISTAL CFX, subtotal occlusion of mid -RCA - CTS consult     ?  Type 2 myocardial infarction (HCC) 06/05/2018   ? CKD (chronic kidney disease) stage 4, GFR 15-29 ml/min (HCC) 05/29/2018   ? 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 Hypertensive urgency 08/12/2015   ? Urinary tract infection 08/12/2015   ? Bipolar affective disorder (HCC) 07/05/2010   ? Essential hypertension 05/07/2009   ? Renal artery stenosis (HCC) 05/07/2009     Formatting of this note might be different from the original.  Status post stent about 3 years ago     ? History of multiple sclerosis (HCC) 05/07/2009   ? Frequent UTI 05/07/2009   ? Depression 05/07/2009   ? Unstable angina (HCC) 05/07/2009 Review of Systems   All other systems reviewed and are negative.      Physical Exam    GEN: no acute distress  HEENT: EOMI, mucous membranes moist, oropharynx is clear  CHEST: clear to auscultation bilaterally, well-healed central sternotomy scar  CV: Reg rhythm, nml rate; nml S1 & S2, no S3 or S4; no rub; no murmurs  ABD: soft, nontender, BS+, no masses or bruits, no organomegaly   EXT: no c/c/e, 2+ distal pulses  NEURO: nonfocal    Cardiovascular Studies      Cardiovascular Health Factors  Vitals BP Readings from Last 3 Encounters:   04/22/22 (!) 189/115   04/15/22 (!) 201/121   03/08/22 137/85     Wt Readings from Last 3 Encounters:   04/22/22 68 kg (150 lb)   04/15/22 69.9 kg (154 lb)   03/08/22 76.2 kg (168 lb)     BMI Readings from Last 3 Encounters:   04/22/22 25.75 kg/m?   04/15/22 26.43 kg/m?   03/08/22 28.84 kg/m?      Smoking Social History     Tobacco Use   Smoking Status Former   ? Packs/day: 2.00   ? Years: 34.00   ? Additional pack years: 0.00   ? Total pack years: 68.00   ? Types: Cigarettes   ? Quit date: 08/15/2018   ? Years since quitting: 3.6   Smokeless Tobacco Never      Lipid Profile Cholesterol   Date Value Ref Range Status   01/16/2019 95  Final     HDL   Date Value Ref Range Status   01/16/2019 36 (L) >50 Final     LDL   Date Value Ref Range Status   01/16/2019 43  Final     Triglycerides   Date Value Ref Range Status   01/16/2019 70  Final      Blood Sugar Hemoglobin A1C   Date Value Ref Range Status   06/06/2018 5.5 4.0 - 6.0 % Final     Comment:     The ADA recommends that most patients with type 1 and type 2 diabetes maintain   an A1c level <7%.       Glucose   Date Value Ref Range Status   06/22/2021 114 (H) 70 - 105 Final   04/15/2021 91 70 - 100 MG/DL Final   45/40/9811 78 70 - 100 MG/DL Final   91/47/8295 89 70 - 110 MG/DL Final   62/13/0865 84 70 - 110 MG/DL Final   78/46/9629 528 (H) 70 - 110 MG/DL Final     Glucose Fasting   Date Value Ref Range Status   07/02/2021 163 (H) 70 - 105 Final     Glucose, POC   Date Value Ref Range Status   06/21/2018 128 (H) 70 - 100 MG/DL  Final   06/21/2018 113 (H) 70 - 100 MG/DL Final   16/05/9603 540 (H) 70 - 100 MG/DL Final          Problems Addressed Today  Encounter Diagnoses   Name Primary?   ? Screening for heart disease Yes   ? Chronic heart failure with preserved ejection fraction (HFpEF) (HCC)    ? Coronary artery disease of native artery of native heart with stable angina pectoris (HCC)    ? History of stent insertion of renal artery    ? Preop cardiovascular exam    ? DOE (dyspnea on exertion)    ? Renal artery stenosis (HCC)    ? S/P CABG x 6    ? Ischemic cardiomyopathy    ? Encounter for pre-transplant evaluation for kidney transplant        Assessment and Plan       She appears euvolemic on my exam today and remains warm and well-perfused in all her extremities.  She continues to report NYHA class II symptoms.  Her last ischemic evaluation from last year was unrevealing.  I would like to update this by referring her for a pharmacologic stress MPI to rule out significant obstructive CAD.  From cardiovascular perspective, she was receiving dual antiplatelet therapy following an ACS presentation and later following an intervention to her renal artery but is no longer requiring dual antiplatelet therapy given stable CAD.  She can discontinue Plavix at this point but should continue with lifelong aspirin uninterrupted.    Her blood pressure is poorly controlled but also quite low on days of dialysis.  She is currently taking hydralazine 100 mg twice daily.  I would like to slightly increase the dose by asking her to take 75 mg of hydralazine 3 times daily.  I encouraged her to hold off the morning and noontime dose on the days of her dialysis and only take it if her systolic blood pressure values remain above 160 mmHg.    She will continue with the rest of her antihypertensive regimen which includes amlodipine, carvedilol, prazosin, torsemide, and spironolactone.  She will also continue with her ranolazine dose.  We will also obtain renal arterial duplex ultrasound to assess the patency of the previously placed renal artery stents.    I reviewed the results of her most recent echocardiogram and her electrocardiogram with her today.  We will decide on her candidacy for renal transplantation when the results of the pharmacologic stress MPI become available.  In the meanwhile I encouraged her to continue to monitor her daily salt intake and continue to measure her blood pressure daily and report systolic values consistently above 140 mmHg.    I appreciate the opportunity to be involved in this nice patient's care.  Please feel free to contact me if you have any additional questions or further insights into her care.  Edwina should continue to follow with her established cardiologist Dr. Feliz Beam love.           Current Medications (including today's revisions)  ? albuterol sulfate (PROAIR HFA) 90 mcg/actuation HFA aerosol inhaler Inhale two puffs by mouth into the lungs every 6 hours as needed for Wheezing or Shortness of Breath. Shake well before use.   ? amLODIPine (NORVASC) 10 mg tablet Take one tablet by mouth daily.   ? aspirin EC 81 mg tablet Take one tablet by mouth daily. Take with food.   ? atorvastatin (LIPITOR) 40 mg tablet Take one tablet by mouth at bedtime daily.   ?  calcitrioL (ROCALTROL) 0.25 mcg capsule    ? carvediloL (COREG) 25 mg tablet TAKE ONE TABLET BY MOUTH TWICE DAILY WITH FOOD   ? cholecalciferol (vitamin D3) (VITAMIN D3) 1,000 units tablet Take two tablets by mouth daily.   ? cyclobenzaprine (FLEXERIL) 5 mg tablet    ? duloxetine DR (CYMBALTA) 60 mg capsule    ? EPINEPHrine (EPIPEN) 1 mg/mL injection pen (2-Pack) Inject 0.3 mL into the muscle once as needed. Inject 0.3 mg (1 Pen) into thigh if needed for anaphylactic reaction. May repeat in 5-15 minutes if needed.   ? fexofenadine (ALLEGRA) 60 mg tablet Take one tablet by mouth daily. ? gabapentin (NEURONTIN) 300 mg capsule Take one capsule by mouth twice daily. (Patient taking differently: Take one capsule by mouth three times daily.)   ? hydrALAZINE (APRESOLINE) 50 mg tablet Take 1.5 tablets PO Three times a day. Hold this medication on Dialysis days, in the mornings and at noon times.   ? isosorbide mononitrate ER (IMDUR) 60 mg ER tablet Take one tablet by mouth every morning.   ? loratadine (CLARITIN) 10 mg tablet Take one tablet by mouth daily.   ? LORazepam (ATIVAN) 1 mg tablet Take one tablet by mouth twice daily as needed (anxiety).   ? mirabegron [facility] ER) 25 mg tablet Take one tablet by mouth daily.   ? multivit with calcium,iron,min [facility] DAILY MULTIVITAMIN PO) Take 1 tablet by mouth daily.   ? multivitamin (ONE-A-DAY) tablet Take one tablet by mouth.   ? nitroglycerin (NITROSTAT) 0.4 mg tablet DISSOLVE ONE TABLET UNDER TONGUE EVERY FIVE MINUTES AS NEEDED FOR CHEST PAIN. *DO NOT EXCEED A TOTAL OF THREE DOSES IN 15 MINUTES. CALL 911 IF CHEST PAIN PERSISTS AFTER THREE DOSES*   ? ondansetron (ZOFRAN ODT) 4 mg rapid dissolve tablet    ? pantoprazole DR (PROTONIX) 40 mg tablet Take one tablet by mouth daily.   ? prazosin (MINIPRESS) 1 mg capsule Take one capsule by mouth at bedtime daily.   ? ranolazine ER (RANEXA) 500 mg tablet Take one tablet by mouth twice daily.   ? senna (SENOKOT) 8.6 mg tablet Take one tablet by mouth twice daily. (Patient taking differently: Take one tablet by mouth as Needed.)   ? sertraline (ZOLOFT) 100 mg tablet Take one tablet by mouth daily.   ? sodium bicarbonate 650 mg tablet Take one tablet by mouth twice daily.   ? spironolactone (ALDACTONE) 25 mg tablet Take one tablet by mouth daily. Take with food.   ? torsemide (DEMADEX) 10 mg tablet    ? traZODone (DESYREL) 50 mg tablet Take one tablet by mouth at bedtime as needed for Sleep.   ? trimethoprim/sulfamethoxazole (BACTRIM SS) 80/400 mg tablet Take one tablet by mouth twice daily.           Total time spent on today's office visit was 40 minutes. This includes face-to-face in person visit with patient as well as non face-to-face time including review of the electronic medical record, outside records, labs, radiologic studies, cardiovascular studies, formulation of treatment plan, after visit summary, future disposition, personal discussions, and documentation.     The free text in this document was generated through Dragon(TM) software with editing and proofreading  done by the author of this document principally at the point of care. Some errors may persist.  If there are questions about content in this document please contact me.

## 2022-04-22 NOTE — Patient Instructions
Follow-Up:    -Thank you for allowing [place] to participate in your care today. Your After Visit Summary is being completed by Vaughan Basta, RN.    -We would like you to follow up in  6 months with Dr. Sandria Manly  -The schedule is released approximately 4-5 months in advance. You will be called by our scheduling department to make an appointment and you will also receive a notification via MyChart to self-schedule.  However, if you would like to call to make this appointment, please call [phone].    -Please schedule the following testing at check out, or by calling our scheduling line: STRESS TEST    Changes From Today's Office Visit  1. Please STOP TAKING PLAVIX (Clopidogrel). Remove from your pill box or cupboard.    2. We've change your hydralazine medication to 75 mg Three Times A Day. You will need to take  1.5 tablets three times a day but on days of dialysis, skip the morning and noon dose.     3. You can schedule your Renal Duplex and your Stress Test along with your 6 month follow up at check out, today.    [facility]    Nuclear Stress Test Instructions    Your cardiologist has asked that you have a nuclear stress test (also known as a Myocardial Perfusion Imaging (MPI) test.    This evaluation of your heart muscle consists of two sets of nuclear images and either a Treadmill stress test or a chemical stress test, decided by you and your physician.    You will get an IV placed in your arm for the test.    You will need to be able to raise your arm up by your head for about 20 minutes and lie on your back for about 10 minutes.  Please discuss this with your doctor or talk with the nuclear technologist or nurse if these are a problem for you.    It is recommended not to schedule any other appointment for the same day.      Wear comfortable clothing and walking shoes if you are walking on the treadmill.  Women should wear shorts or comfortable pants instead of dresses.   Sweatshirts or T-shirts work really well for imaging.    There may be enough time to leave to get a snack after the stress portion of the test.   The Technologist will tell you what time to return for the second set of images.    PLEASE NO CAFFEINE 24 HOURS PRIOR TO TEST:  Examples include coffee, tea, decaffeinated drinks, colas, [facility], Dr. Reino Kent.  Some orange sodas and root beers have caffeine, please check.  No energy drinks, Excedrin, Midol, or any foods CONTAINING CHOCOLATE.   Consuming Caffeine may postpone your test.    PLEASE DO NOT EAT OR DRINK THE MORNING OF YOUR TEST.  Water is ok to drink with your morning medications.    Please hold these medications the day of test:  None to hold.    DIABETIC PATIENTS:  if insulin dependent:  please take one third of your insulin with two pieces of dry toast and a small juice.  Bring remaining 2/3   insulin and oral diabetic medications with you to your test.    PLEASE NO TOBBACCO PRODUCTS BETWEEN SCANS  You will NOT need a driver for this test.  But are welcome to bring a visitor with you.   Visitors will not be able to  accompany you back to stress room.   Please do not bring children to the nuclear stress test.    TEST FINDINGS:   You will receive the results of the test within 7 business days of completion of this test by telephone. If you have any questions concerning your nuclear stress test or if you do not hear from your cardiologist/or nurse with 7 business days, please call our office.    Contacting our office:    -For NON-URGENT questions please contact us via message through your MyChart account.   -For all medication refills please contact your pharmacy or send a request through MyChart.     -For all questions that may need to be addressed urgently please call the [facility] nursing triage line at [phone] Monday - Friday 8am-5pm only. Please leave a detailed message with your name, date of birth, and reason for your call.  If your message is received before 3:30pm, every effort will be made to call you back the same day.  Please allow time for [place] to review your chart prior to call back.     -Should you have an urgent concern over the weekend/nights, the on-call triage line is [phone].    Doylene Canning team fax number: [phone]    -You may receive a survey in the upcoming weeks from [facility] [place] [facility]. Your feedback is important to [place] and helps [place] continue to improve patient care and patient satisfaction.     -Please feel free to call our Financial Department at [phone] with any questions or concerns about estimated cost of testing or imaging ordered today. We are happy to provide CPT codes upon request.    Results & Testing Follow Up:    -Please allow 5-7 business days for the results of any testing to be reviewed. Please call our office if you have not heard from a nurse within this time frame.    -Should you choose to complete testing at an outside facility, please contact our office after completion of testing so that we can ensure that we have received results for your provider to review.    Lab and test results:  As a part of the CARES act, starting 11/14/2019, some results will be released to you via MyChart immediately and automatically.  You may see results before your provider sees them; however, your provider will review all these results and then they, or one of their team, will notify you of result information and recommendations.   Critical results will be addressed immediately, but otherwise, please allow [place] time to get back with you prior to you reaching out to [place] for questions.  This will usually take about 72 hours for labs and 5-7 days for procedure test results.

## 2022-04-23 DIAGNOSIS — Z9889 Other specified postprocedural states: Secondary | ICD-10-CM

## 2022-04-23 DIAGNOSIS — R0609 Other forms of dyspnea: Secondary | ICD-10-CM

## 2022-05-03 ENCOUNTER — Encounter: Admit: 2022-05-03 | Discharge: 2022-05-03 | Payer: MEDICARE | Primary: Family

## 2022-05-04 ENCOUNTER — Encounter: Admit: 2022-05-04 | Discharge: 2022-05-04 | Payer: MEDICARE | Primary: Family

## 2022-05-04 NOTE — Telephone Encounter
Patient was contacted regarding NPO instructions for scheduled PV study. No voicemail was left per patient permissions

## 2022-05-05 ENCOUNTER — Encounter: Admit: 2022-05-05 | Discharge: 2022-05-05 | Payer: MEDICARE | Primary: Family

## 2022-05-05 ENCOUNTER — Observation Stay: Admit: 2022-05-05 | Discharge: 2022-05-05 | Payer: MEDICARE | Primary: Family

## 2022-05-05 ENCOUNTER — Emergency Department: Admit: 2022-05-05 | Discharge: 2022-05-05 | Payer: MEDICARE

## 2022-05-05 DIAGNOSIS — I10 Essential (primary) hypertension: Secondary | ICD-10-CM

## 2022-05-05 DIAGNOSIS — N19 Unspecified kidney failure: Secondary | ICD-10-CM

## 2022-05-05 DIAGNOSIS — N390 Urinary tract infection, site not specified: Secondary | ICD-10-CM

## 2022-05-05 DIAGNOSIS — E785 Hyperlipidemia, unspecified: Secondary | ICD-10-CM

## 2022-05-05 DIAGNOSIS — R918 Other nonspecific abnormal finding of lung field: Secondary | ICD-10-CM

## 2022-05-05 DIAGNOSIS — F32A Depression: Secondary | ICD-10-CM

## 2022-05-05 DIAGNOSIS — G35 Multiple sclerosis: Secondary | ICD-10-CM

## 2022-05-05 DIAGNOSIS — F319 Bipolar disorder, unspecified: Secondary | ICD-10-CM

## 2022-05-05 DIAGNOSIS — I701 Atherosclerosis of renal artery: Secondary | ICD-10-CM

## 2022-05-05 DIAGNOSIS — K5792 Diverticulitis of intestine, part unspecified, without perforation or abscess without bleeding: Secondary | ICD-10-CM

## 2022-05-05 DIAGNOSIS — J189 Pneumonia, unspecified organism: Secondary | ICD-10-CM

## 2022-05-05 DIAGNOSIS — R55 Syncope and collapse: Secondary | ICD-10-CM

## 2022-05-05 DIAGNOSIS — T50905A Adverse effect of unspecified drugs, medicaments and biological substances, initial encounter: Secondary | ICD-10-CM

## 2022-05-05 LAB — POC GLUCOSE: POC GLUCOSE: 106 mg/dL — ABNORMAL HIGH (ref 70–100)

## 2022-05-05 LAB — POC HEMATOCRIT
HEMATOCRIT POC: 32 % — ABNORMAL LOW (ref 36–45)
HEMOGLOBIN POC: 10 g/dL — ABNORMAL LOW (ref 12.0–15.0)

## 2022-05-05 LAB — URINALYSIS MICROSCOPIC REFLEX TO CULTURE

## 2022-05-05 LAB — CBC AND DIFF
ABSOLUTE BASO COUNT: 0 K/UL (ref 0–0.20)
ABSOLUTE EOS COUNT: 0.2 K/UL (ref 0–0.45)
ABSOLUTE MONO COUNT: 0.5 K/UL (ref 0–0.80)
MDW (MONOCYTE DISTRIBUTION WIDTH): 24 — ABNORMAL HIGH (ref <20.7)
WBC COUNT: 5.6 K/UL (ref 4.5–11.0)

## 2022-05-05 LAB — POC BLOOD GAS VEN
BICARB, VEN POC: 26 MMOL/L
O2 SAT, VEN POC: 40 % — ABNORMAL LOW (ref 55–71)
PCO2, VEN POC: 45 mmHg (ref 36–50)
PH, VEN POC: 7.3 (ref 7.30–7.40)

## 2022-05-05 LAB — PROCALCITONIN: PROCALCITONIN: 0.3 ng/mL (ref 3.5–5.0)

## 2022-05-05 LAB — HIGH SENSITIVITY TROPONIN I 0 HOUR: HIGH SENSITIVITY TROPONIN I 0 HOUR: 16 ng/L — ABNORMAL HIGH (ref <12)

## 2022-05-05 LAB — RVP VIRAL PANEL PCR

## 2022-05-05 LAB — COMPREHENSIVE METABOLIC PANEL
ALBUMIN: 3.8 g/dL (ref 3.5–5.0)
ALK PHOSPHATASE: 159 U/L — ABNORMAL HIGH (ref 25–110)
ALT: 6 U/L — ABNORMAL LOW (ref 7–56)
ANION GAP: 16 K/UL — ABNORMAL HIGH (ref 3–12)
AST: 22 U/L (ref 7–40)
BLD UREA NITROGEN: 53 mg/dL — ABNORMAL HIGH (ref 7–25)
CALCIUM: 8.6 mg/dL — ABNORMAL HIGH (ref 8.5–10.6)
CO2: 21 MMOL/L (ref 21–30)
CREATININE: 9 mg/dL — ABNORMAL HIGH (ref 0.4–1.00)
POTASSIUM: 4 MMOL/L — ABNORMAL LOW (ref 3.5–5.1)
SODIUM: 137 MMOL/L — ABNORMAL LOW (ref 137–147)
TOTAL BILIRUBIN: 0.5 mg/dL (ref 0.3–1.2)
TOTAL PROTEIN: 6.8 g/dL (ref 6.0–8.0)

## 2022-05-05 LAB — URINALYSIS DIPSTICK REFLEX TO CULTURE
NITRITE: NEGATIVE
URINE ASCORBIC ACID, UA: NEGATIVE
URINE BILE: NEGATIVE
URINE BLOOD: NEGATIVE
URINE KETONE: NEGATIVE

## 2022-05-05 LAB — INFLUENZA A/B AND RSV PCR
FLU A: NEGATIVE
FLU B: NEGATIVE
RSV: NEGATIVE

## 2022-05-05 LAB — POC POTASSIUM: POTASSIUM, POC: 3.9 MMOL/L (ref 3.5–5.1)

## 2022-05-05 LAB — POC SODIUM: SODIUM, POC: 137 MMOL/L (ref 137–147)

## 2022-05-05 LAB — COVID-19 (SARS-COV-2) PCR

## 2022-05-05 LAB — HIGH SENSITIVITY TROPONIN I 2 HOUR: HIGH SENSITIVITY TROPONIN I 2 HOUR: 12 ng/L — ABNORMAL HIGH (ref <12)

## 2022-05-05 MED ORDER — DOXYCYCLINE HYCLATE 100 MG PO TAB
100 mg | Freq: Two times a day (BID) | ORAL | 0 refills | Status: DC
Start: 2022-05-05 — End: 2022-05-05

## 2022-05-05 MED ORDER — SENNOSIDES-DOCUSATE SODIUM 8.6-50 MG PO TAB
1 | Freq: Every day | ORAL | 0 refills | Status: AC | PRN
Start: 2022-05-05 — End: ?
  Administered 2022-05-07: 14:00:00 1 via ORAL

## 2022-05-05 MED ORDER — ONDANSETRON HCL (PF) 4 MG/2 ML IJ SOLN
4 mg | INTRAVENOUS | 0 refills | Status: AC | PRN
Start: 2022-05-05 — End: ?

## 2022-05-05 MED ORDER — PRAZOSIN 1 MG PO CAP
1 mg | Freq: Every evening | ORAL | 0 refills | Status: AC
Start: 2022-05-05 — End: ?
  Administered 2022-05-06 – 2022-05-13 (×8): 1 mg via ORAL

## 2022-05-05 MED ORDER — RANOLAZINE 500 MG PO TB12
500 mg | Freq: Two times a day (BID) | ORAL | 0 refills | Status: AC
Start: 2022-05-05 — End: ?
  Administered 2022-05-06 – 2022-05-13 (×16): 500 mg via ORAL

## 2022-05-05 MED ORDER — ALBUTEROL SULFATE 90 MCG/ACTUATION IN HFAA
2 | RESPIRATORY_TRACT | 0 refills | Status: DC | PRN
Start: 2022-05-05 — End: 2022-05-05

## 2022-05-05 MED ORDER — IPRATROPIUM-ALBUTEROL 0.5 MG-3 MG(2.5 MG BASE)/3 ML IN NEBU
3 mL | RESPIRATORY_TRACT | 0 refills | Status: AC | PRN
Start: 2022-05-05 — End: ?

## 2022-05-05 MED ORDER — GABAPENTIN 300 MG PO CAP
300 mg | Freq: Three times a day (TID) | ORAL | 0 refills | Status: DC
Start: 2022-05-05 — End: 2022-05-05

## 2022-05-05 MED ORDER — ALBUTEROL SULFATE 90 MCG/ACTUATION IN HFAA
2 | RESPIRATORY_TRACT | 0 refills | Status: AC | PRN
Start: 2022-05-05 — End: ?

## 2022-05-05 MED ORDER — IPRATROPIUM-ALBUTEROL 0.5 MG-3 MG(2.5 MG BASE)/3 ML IN NEBU
3 mL | RESPIRATORY_TRACT | 0 refills | Status: DC | PRN
Start: 2022-05-05 — End: 2022-05-06
  Administered 2022-05-06: 03:00:00 3 mL via RESPIRATORY_TRACT

## 2022-05-05 MED ORDER — ATORVASTATIN 40 MG PO TAB
40 mg | Freq: Every evening | ORAL | 0 refills | Status: AC
Start: 2022-05-05 — End: ?
  Administered 2022-05-06 – 2022-05-12 (×7): 40 mg via ORAL

## 2022-05-05 MED ORDER — ONDANSETRON 4 MG PO TBDI
4 mg | ORAL | 0 refills | Status: AC | PRN
Start: 2022-05-05 — End: ?
  Administered 2022-05-06: 14:00:00 4 mg via ORAL

## 2022-05-05 MED ORDER — SODIUM CHLORIDE 0.9 % IV SOLP
100 mL | INTRAVENOUS | 0 refills | PRN
Start: 2022-05-05 — End: ?

## 2022-05-05 MED ORDER — MELATONIN 5 MG PO TAB
5 mg | Freq: Every evening | ORAL | 0 refills | Status: AC | PRN
Start: 2022-05-05 — End: ?

## 2022-05-05 MED ORDER — LIDOCAINE (PF) 10 MG/ML (1 %) IJ SOLN
2 mL | Freq: Once | INTRADERMAL | 0 refills | PRN
Start: 2022-05-05 — End: ?
  Administered 2022-05-06: 21:00:00 2 mL via INTRADERMAL

## 2022-05-05 MED ORDER — GABAPENTIN 300 MG PO CAP
300 mg | Freq: Every evening | ORAL | 0 refills | Status: AC
Start: 2022-05-05 — End: ?
  Administered 2022-05-07 – 2022-05-13 (×7): 300 mg via ORAL

## 2022-05-05 MED ORDER — CHOLECALCIFEROL (VITAMIN D3) 25 MCG (1,000 UNIT) PO TAB
2000 [IU] | Freq: Every day | ORAL | 0 refills | Status: AC
Start: 2022-05-05 — End: ?
  Administered 2022-05-06 – 2022-05-13 (×8): 2000 [IU] via ORAL

## 2022-05-05 MED ORDER — SERTRALINE 100 MG PO TAB
100 mg | Freq: Every day | ORAL | 0 refills | Status: AC
Start: 2022-05-05 — End: ?
  Administered 2022-05-06 – 2022-05-13 (×8): 100 mg via ORAL

## 2022-05-05 MED ORDER — HEPARIN, PORCINE (PF) 5,000 UNIT/0.5 ML IJ SYRG
5000 [IU] | SUBCUTANEOUS | 0 refills | Status: AC
Start: 2022-05-05 — End: ?
  Administered 2022-05-05 – 2022-05-10 (×14): 5000 [IU] via SUBCUTANEOUS

## 2022-05-05 MED ORDER — SODIUM CHLORIDE 0.9 % IV SOLP
2000 mL | INTRAVENOUS | 0 refills | PRN
Start: 2022-05-05 — End: ?

## 2022-05-05 MED ORDER — LACTATED RINGERS IV SOLP
500 mL | Freq: Once | INTRAVENOUS | 0 refills | Status: CP
Start: 2022-05-05 — End: ?

## 2022-05-05 MED ORDER — POLYETHYLENE GLYCOL 3350 17 GRAM PO PWPK
1 | Freq: Every day | ORAL | 0 refills | Status: AC | PRN
Start: 2022-05-05 — End: ?
  Administered 2022-05-07: 14:00:00 17 g via ORAL

## 2022-05-05 MED ORDER — SODIUM CHLORIDE 0.9 % IV SOLP
300 mL | INTRAVENOUS | 0 refills | PRN
Start: 2022-05-05 — End: ?
  Administered 2022-05-06: 21:00:00 300 mL via INTRAVENOUS

## 2022-05-05 MED ORDER — FLU VACC QS2023-24 6MOS UP(PF) 60 MCG (15 MCG X 4)/0.5 ML IM SYRG
.5 mL | Freq: Once | INTRAMUSCULAR | 0 refills | Status: AC
Start: 2022-05-05 — End: ?
  Administered 2022-05-10: 17:00:00 0.5 mL via INTRAMUSCULAR

## 2022-05-05 MED ORDER — ASPIRIN 81 MG PO TBEC
81 mg | Freq: Every day | ORAL | 0 refills | Status: AC
Start: 2022-05-05 — End: ?
  Administered 2022-05-06 – 2022-05-13 (×7): 81 mg via ORAL

## 2022-05-05 MED ADMIN — LACTATED RINGERS IV SOLP [4318]: 500 mL | INTRAVENOUS | @ 15:00:00 | Stop: 2022-05-05 | NDC 00338011704

## 2022-05-05 NOTE — Progress Notes
Report called to Allen Derry., RN on [phone]. Receiving nurse denies any questions, comments or concerns at this time. Transportation request placed.

## 2022-05-05 NOTE — ED Notes
Pt presents to ED17 via EMS CC Weakness/fall. Pt AOx4, denies SOA or chest pains at this time. Pt reports she was walking in the parking lot en route to cardiac stress test when she became dizzy/weak and had near syncopal event, falling to knees then to sitting position. Fall was witnessed by bystanders who reported to EMS. Pt denies LOC, denies head or neck pain/injury. Pt reports she has been NPO since last night for stress test, and feels dehydrated. Pt reports vision changes and dizziness currently while sitting in cart. Pt reports x2 weekly dialysis treatments on Saturday/Tuesday, last dialyzed on Saturday, missed Tuesday due to feeling unwell which she attributes to seasonal allergies.  ED provider at bedside at this time.     Belongings: Glasses, shirt x2, pants, socks, shoes, bag/purse, medical alert bracelet. Belongings at bedside with pt/daughter - alert bracelet given to daughter.

## 2022-05-05 NOTE — Progress Notes
Patient alert and oriented x4. Profile updated, care plan/education reviewed, and call light within reach.

## 2022-05-05 NOTE — ED Provider Notes
Chief Complaint   Patient presents with   ? Weakness     Fall due to leg weakness, denies injury, denies hitting head or LOC.    ? 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  She missed her dialysis on Tuesday and has started dialysis a month ago.  She was coming to the hospital to do a stress test as a requirement to get on the kidney transplant list and while in the parking lot she started getting lightheaded and dizzy with some visual disturbances and used her hands and knees to sit and then lay down.  EMS brought her here and stated that her blood pressure was on the lower end in the 90s.  She states that she has not eaten or drank water since 11 PM yesterday in preparation for her stress test.  She states that she has had no chest pain, or shortness of breath before her syncopal episode.          Review of Systems:  Review of Systems   Constitutional: Negative for appetite change, fatigue and fever.   HENT: Negative for congestion and sore throat.    Eyes: Positive for visual disturbance.   Respiratory: Negative for chest tightness and shortness of breath.    Cardiovascular: Negative for chest pain and palpitations.   Gastrointestinal: Positive for nausea. Negative for abdominal pain, constipation, diarrhea and vomiting.   Endocrine: Negative.    Genitourinary: Negative for dysuria and hematuria.   Musculoskeletal: Negative for back pain and neck pain.   Allergic/Immunologic: Negative.    Neurological: Positive for dizziness and light-headedness. Negative for speech difficulty and headaches.       Allergies:  Ibuprofen, Nicardipine, Penicillins, Aspirin, Cephalosporins, Ciprofloxacin, Iodinated contrast media, and Olmesartan    Past Medical History:  Medical History:   Diagnosis Date   ? Adverse drug reaction    ? Bipolar disorder (HCC)    ? Depression 05/07/2009   ? Diverticulitis    ? Essential hypertension 05/07/2009   ? Frequent UTI 05/07/2009   ? Hyperlipemia    ? Hypertension 05/07/2009   ? Lung mass    ? Multiple sclerosis (HCC) 05/07/2009   ? 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 ANGIOGRAPHY RENAL ARTERY - BILATERAL WITH POSSIBLE STENT PLACEMENT Bilateral 05/22/2018    Performed by Greig Castilla, MD at [facility] CATH LAB   ? ANGIOGRAPHY CORONARY ARTERY WITH LEFT HEART CATHETERIZATION N/A 06/05/2018    Performed by Laney Pastor, MD at [facility] CATH LAB   ? PERCUTANEOUS CORONARY STENT PLACEMENT WITH ANGIOPLASTY N/A 06/05/2018    Performed by Laney Pastor, MD at [facility] CATH LAB   ? CORONARY ARTERY BYPASS WITH ARTERIAL GRAFT - 6 GRAFTS (Internal Mammary Artery and Endovascular Vein Harvest) N/A 06/11/2018    Performed by Collene Schlichter, MD at [facility] CVOR   ? ABDOMINAL EXPLORATION SURGERY      Patient reports this surgery was done for ruptured diverticuli   ? CARDIAC CATHERIZATION     ? CHOLECYSTECTOMY     ? DOPPLER ECHOCARDIOGRAPHY     ? ELECTROCARDIOGRAM     ? HX OOPHORECTOMY Right    ? HX  TUBAL LIGATION         Pertinent medical and surgical history reviewed.    Social History:  Social History     Tobacco Use   ? Smoking status: Former     Packs/day: 2.00     Years: 34.00     Additional pack years: 0.00     Total pack years: 68.00     Types: Cigarettes     Quit date: 08/15/2018     Years since quitting: 3.7   ? Smokeless tobacco: Never   Vaping Use   ? Vaping Use: Never used   Substance Use Topics   ? Alcohol use: No   ? Drug use: No     Social History     Substance and Sexual Activity   Drug Use No             Family History:  Family History   Problem Relation Age of Onset   ? Coronary Artery Disease Mother    ? Heart Disease Mother    ? Heart Attack Mother 41   ? Cancer Father         Colon Cancer   ? Heart Surgery Sister 36   ? Heart Disease Sister    ? Heart Attack Sister    ? Heart Surgery Brother 14        CABG   ? Heart Disease Brother    ? Heart Attack Maternal Grandmother    ? Stroke Maternal Grandmother    ? Heart Disease Maternal Grandmother    ? Heart Disease Brother    ? Heart Surgery Brother 56   ? SLE Sister    ? SLE Other    ? Other Maternal Uncle         Polio   ? Multiple sclerosis Neg Hx        Vitals:  ED Vitals    Date and Time T BP P RR SPO2P SPO2 User   05/05/22 1330 -- 127/84 76 16 PER MINUTE 75 93 % DL   16/10/96 0454 -- -- 74 14 PER MINUTE 74 91 % DL   09/81/19 1478 -- 295/62 73 15 PER MINUTE 73 88 % DL   13/08/65 7846 -- 962/95 74 15 PER MINUTE 74 90 % DL   28/41/32 4401 -- -- -- -- -- 91 % DL   02/72/53 6644 -- 034/74 70 14 PER MINUTE 70 -- DL   25/95/63 8756 -- -- -- -- -- 92 % DL   43/32/95 1884 -- 166/06 70 13 PER MINUTE 69 -- DL   30/16/01 0932 -- 355/73 68 15 PER MINUTE 68 -- DL   22/02/54 2706 -- -- 68 14 PER MINUTE 68 92 % DL   23/76/28 3151 -- -- -- -- 71 -- DL   76/16/07 3710 -- 62/69 -- -- 70 91 % DL   48/54/62 7035 -- -- -- -- 66 -- DL   00/93/81 8299 -- 37/16 69 14 PER MINUTE 69 90 % DL   96/78/93 8101 -- 75/10 69 16 PER MINUTE 69 90 % DL   25/85/27 7824 23.5 ?C (98.4 ?F) -- -- 13 PER MINUTE 68 92 % DL   36/14/43 1540 -- 08/67 -- -- 67 -- DL          Physical Exam:  Physical Exam  Vitals and nursing note reviewed.   Constitutional:       General: She is not in acute distress.  Appearance: Normal appearance. She is not ill-appearing.   HENT:      Head: Normocephalic and atraumatic.      Mouth/Throat:      Mouth: Mucous membranes are moist.      Pharynx: Oropharynx is clear. No oropharyngeal exudate.   Eyes:      General:         Right eye: No discharge.         Left eye: No discharge.      Extraocular Movements: Extraocular movements intact.      Conjunctiva/sclera: Conjunctivae normal.      Pupils: Pupils are equal, round, and reactive to light.   Cardiovascular:      Rate and Rhythm: Normal rate and regular rhythm.      Pulses: Normal pulses.      Heart sounds: Normal heart sounds. No murmur heard.     No friction rub. No gallop.   Pulmonary:      Effort: Pulmonary effort is normal. No respiratory distress.      Breath sounds: Normal breath sounds. No stridor. No wheezing or rhonchi.   Abdominal:      General: There is no distension.      Palpations: Abdomen is soft. There is no mass.      Tenderness: There is no abdominal tenderness. There is no guarding.   Musculoskeletal:         General: No swelling, tenderness or deformity. Normal range of motion.      Cervical back: Normal range of motion and neck supple. No rigidity or tenderness.   Skin:     General: Skin is warm.      Coloration: Skin is not jaundiced or pale.   Neurological:      General: No focal deficit present.      Mental Status: She is alert and oriented to person, place, and time. Mental status is at baseline.      Cranial Nerves: No cranial nerve deficit.      Sensory: No sensory deficit.      Motor: No weakness.   Psychiatric:         Mood and Affect: Mood normal.         Behavior: Behavior normal.         Laboratory Results:  Labs Reviewed   POC GLUCOSE - Abnormal       Result Value Ref Range Status    Glucose, POC 106 (*) 70 - 100 MG/DL Final   POC BLOOD GAS VEN - Abnormal    PH-VEN-POC 7.37  7.30 - 7.40 Final    PCO2-VEN-POC 45  36 - 50 MMHG Final    PO2-VEN-POC 24 (*) 33 - 48 MMHG Final    Base Ex-VEN-POC 1.0  MMOL/L Final    O2 Sat-VEN-POC 40.0 (*) 55 - 71 % Final    Bicarbonate-VEN-POC 26.0  MMOL/L Final   POC HEMATOCRIT - Abnormal    Hemoglobin POC 10.9 (*) 12.0 - 15.0 GM/DL Final    Hematocrit POC 32.0 (*) 36 - 45 % Final   CBC AND DIFF - Abnormal    White Blood Cells 5.6  4.5 - 11.0 K/UL Final    RBC 3.70 (*) 4.0 - 5.0 M/UL Final    Hemoglobin 10.9 (*) 12.0 - 15.0 GM/DL Final    Hematocrit 16.1 (*) 36 - 45 % Final    MCV 85.1  80 - 100 FL Final    MCH 29.4  26 - 34 PG Final  MCHC 34.5  32.0 - 36.0 G/DL Final    RDW 16.1 (*) 11 - 15 % Final Platelet Count 208  150 - 400 K/UL Final    MPV 8.7  7 - 11 FL Final    Neutrophils 69  41 - 77 % Final    Lymphocytes 14 (*) 24 - 44 % Final    Monocytes 11  4 - 12 % Final    Eosinophils 5  0 - 5 % Final    Basophils 1  0 - 2 % Final    Absolute Neutrophil Count 3.92  1.8 - 7.0 K/UL Final    Absolute Lymph Count 0.75 (*) 1.0 - 4.8 K/UL Final    Absolute Monocyte Count 0.59  0 - 0.80 K/UL Final    Absolute Eosinophil Count 0.27  0 - 0.45 K/UL Final    Absolute Basophil Count 0.03  0 - 0.20 K/UL Final    MDW (Monocyte Distribution Width) 24.4 (*) <20.7 Final   COMPREHENSIVE METABOLIC PANEL - Abnormal    Sodium 137  137 - 147 MMOL/L Final    Potassium 4.0  3.5 - 5.1 MMOL/L Final    Chloride 100  98 - 110 MMOL/L Final    Glucose 87  70 - 100 MG/DL Final    Blood Urea Nitrogen 53 (*) 7 - 25 MG/DL Final    Creatinine 0.96 (*) 0.4 - 1.00 MG/DL Final    Calcium 8.6  8.5 - 10.6 MG/DL Final    Total Protein 6.8  6.0 - 8.0 G/DL Final    Total Bilirubin 0.5  0.3 - 1.2 MG/DL Final    Albumin 3.8  3.5 - 5.0 G/DL Final    Alk Phosphatase 159 (*) 25 - 110 U/L Final    AST (SGOT) 22  7 - 40 U/L Final    CO2 21  21 - 30 MMOL/L Final    ALT (SGPT) 6 (*) 7 - 56 U/L Final    Anion Gap 16 (*) 3 - 12 Final    eGFR 5 (*) >60 mL/min Final   HIGH SENSITIVITY TROPONIN I 0 HOUR - Abnormal    hs Troponin I 0 Hour 16 (*) <12 ng/L Final   HIGH SENSITIVITY TROPONIN I 2 HOUR - Abnormal    hs Troponin I 2 Hour 12 (*) <12 ng/L Final   NT-PRO-BNP - Abnormal    NT-Pro-BNP 7,323.0 (*) <125 pg/mL Final   COVID-19 (SARS-COV-2) PCR   INFLUENZA A/B AND RSV PCR   POC POTASSIUM    Potassium-POC 3.9  3.5 - 5.1 MMOL/L Final   POC SODIUM    Sodium-POC 137  137 - 147 MMOL/L Final   HIGH SENSITIVITY TROPONIN I 4 HR   URINALYSIS DIPSTICK REFLEX TO CULTURE   URINALYSIS MICROSCOPIC REFLEX TO CULTURE   UA GREY TOP TUBE   CLEAR TOP EXTRA URINE TUBE   PROCALCITONIN     POC Glucose (Download): (!) 106    Radiology Interpretation:    CHEST SINGLE VIEW   Final Result Abnormal         Development of patchy right lower lung consolidation most compatible with pneumonia. Follow-up chest radiograph in 6-8 weeks is recommended to assess for resolution, particularly in light of smoking history.      #FOLLOW          Approved by Tally Due, MD on 05/05/2022 11:55 AM      By my electronic signature, I attest that I have personally reviewed  the images for this examination and formulated the interpretations and opinions expressed in this report          Finalized by Golda Acre, M.D. on 05/05/2022 12:29 PM. Dictated by Tally Due, MD on 05/05/2022 11:29 AM.               EKG:  ECG Results          [facility] ED MAIN ECG TRIAGE ONLY (Final result)      Collection Time Result Time VT RATE P-R Interval QRS DURATION Q-T Interval QTC Calc Bazett    05/05/22 10:20:23 05/05/22 10:37:33 66 154 106 486 509         Collection Time Result Time P Axis R Axis T Axis    05/05/22 10:20:23 05/05/22 10:37:33 52 52 71               Final result                 Impression:    Normal sinus rhythm  Possible Left atrial enlargement  Left ventricular hypertrophy ( Sokolow-Lyon , Cornell product )  Cannot rule out Septal infarct (cited on or before 22-Apr-2022)  Prolonged QT  Abnormal ECG  When compared with ECG of 22-Apr-2022 09:42,  No significant change was found  Confirmed by Cannon, [place] ([id number]) on 05/05/2022 10:37:31 AM                              Medical Decision Making:  Debra Hall is a 51 y.o. female who presents with chief complaint as listed above. Based on the history and presentation, the list of differential diagnoses considered included, but was not limited to, congestive heart failure exacerbation, myocardial infarction, orthostatic syncope.    ED Course  51 y.o. female presented to the ED for syncope.     Patient was evaluated by a resident and an attending physician. Available records were reviewed. Vitals were noted as above.    Initial labs and imaging were obtained. These included a CBC, CMP, troponins, BNP, urinalysis, orthostatics, pneumonia and chest x-ray. Patient was given 500 mL of lactated Ringer's for symptomatic relief.  Her orthostatics showed that her blood pressure dropped slightly and her pulse increased.  Her CBC did not show any pertinent abnormalities.  Her creatinine and her CMP was elevated at 9.06.  Her BNP was in the 7000's.  She is due for dialysis as she missed her last one.  Her chest x-ray showed that she may have a pneumonia.  Medicine team was consulted for admission for further cardiac work-up, dialysis, pneumonia and further syncope work-up.  Patient agreed to be admitted to the hospital.    Admitting team informed and updated on ED findings at time of phone handoff.     On re-evaluation, patient is in no apparent distress.     Findings discussed with patient along with the plan for admission. Patient reports understanding and is in agreement with the plan for admission. Patient reassessed with appropriate vital signs and symptom control for transfer to the floors. Patient verbalized understanding. Admission team was notified and patient was accepted to inpatient service. All questions were answered prior to transfer to inpatient service.           Complexity of Problems Addressed  Patient's active diagnoses as well as contributing pre-existing medical problems include:  Clinical Impression   Pneumonia due to infectious organism, unspecified laterality, unspecified part of  lung   Syncope and collapse     Evaluation performed for potential threat to life or bodily function during this visit given the initial differential diagnosis and clinical impression(s) as discussed previously in MDM/ED course.    Additional data reviewed:    ? History was obtained from an independent historian: Not in addition to what is mentioned above  ? Prior non-ED notes reviewed: Not in addition to what is mentioned above  ? Independent interpretation of diagnostic tests was performed by me: Not in addition to what is mentioned above  ? Patient presentation/management was discussed with the following qualified health care professionals and/or other relevant professionals: Not in addition to what is mentioned above    Risk evaluation:    ? Diagnosis or treatment of patient condition impacted by social determinant of health: None  ? Tests Considered but not performed due to clinical scoring (if not mentioned in ED course, aside from what is implied by clinical scores listed): N/A  ? Rationale regarding whether admission or escalation of care considered if not performed (if not mentioned in ED course, aside from what is implied by clinical scores listed): N/A    ED Scoring:                                Facility Administered Meds:  Medications   lactated ringers infusion (0 mL Intravenous Infusion Stopped 05/05/22 1230)       Clinical Impression:  Clinical Impression   Pneumonia due to infectious organism, unspecified laterality, unspecified part of lung   Syncope and collapse       Disposition/Follow up  ED Disposition     ED Disposition   Admit           No follow-up provider specified.    Medications:  New Prescriptions    No medications on file       Procedure Notes:  Procedures           Iran Planas, MD  PGY-1, Department of Emergency Medicine   Voalte: [phone]  05/05/2022 2:00 PM

## 2022-05-06 ENCOUNTER — Encounter: Admit: 2022-05-06 | Discharge: 2022-05-06 | Payer: MEDICARE | Primary: Family

## 2022-05-06 ENCOUNTER — Observation Stay: Admit: 2022-05-06 | Discharge: 2022-05-06 | Payer: MEDICARE | Primary: Family

## 2022-05-06 MED ADMIN — RP DX N-13 AMMONIA MCI [210498]: 20.8 | INTRAVENOUS | @ 13:00:00 | Stop: 2022-05-06 | NDC 54029258109

## 2022-05-06 MED ADMIN — IPRATROPIUM-ALBUTEROL 0.5 MG-3 MG(2.5 MG BASE)/3 ML IN NEBU [77459]: 3 mL | RESPIRATORY_TRACT | @ 18:00:00 | NDC 00378967131

## 2022-05-06 MED ADMIN — ACETAMINOPHEN 325 MG PO TAB [101]: 650 mg | ORAL | @ 18:00:00 | NDC 00904677361

## 2022-05-06 MED ADMIN — DOXYCYCLINE HYCLATE 100 MG PO TAB [2625]: 100 mg | ORAL | @ 14:00:00 | NDC 00904043004

## 2022-05-06 MED ADMIN — REGADENOSON 0.4 MG/5 ML IV SYRG [168865]: 0.4 mg | INTRAVENOUS | @ 13:00:00 | Stop: 2022-05-06 | NDC 00469650189

## 2022-05-06 MED ADMIN — POTASSIUM CHLORIDE 20 MEQ PO TBTQ [35943]: 20 meq | ORAL | @ 14:00:00 | Stop: 2022-05-06 | NDC 00832532511

## 2022-05-06 NOTE — Care Coordination-Inpatient
This patient has been assigned to [facility]. The covering provider can be reached via Voalte, under the [facility] First Call. Voalte is the preferred method of communication, but if needed, the pager number for this team is [facility]- [phone]

## 2022-05-06 NOTE — Progress Notes
RT Adult Assessment Note    NAME:Debra Hall             MRN: [id number]             DOB:Mar 19, 1971          AGE: 51 y.o.  ADMISSION DATE: 05/05/2022             DAYS ADMITTED: LOS: 0 days    RT Treatment Plan:  Protocol Plan: Medications  Albuterol: MDI PRN;Nebulizer PRN  Ipratropium: Nebulizer PRN    Protocol Plan: Procedures  Oscillating PEP Therapy: Q6h while awake & PRN  PEP Therapy: Place a nursing order for "IS Q1h While Awake" for any of Lung Expansion indicators    Additional Comments:  Impressions of the patient: Patient resting in bed, respirations even. No distress noted upon exam. Patient states to take duoneb every 4-6 hrs PRN. Also has Alb MDI PRN.   Intervention(s)/outcome(s): RT eval  Patient education that was completed: n/a  Recommendations to the care team: RT tx plan above.     Vital Signs:  Pulse: 86  RR: 18 PER MINUTE  SpO2: 98 %  O2 Device: None (Room air)  Liter Flow:    O2%:    Breath Sounds: Fine crackles  Respiratory Effort: Labored

## 2022-05-07 ENCOUNTER — Observation Stay: Admit: 2022-05-07 | Discharge: 2022-05-07 | Payer: MEDICARE | Primary: Family

## 2022-05-07 ENCOUNTER — Encounter: Admit: 2022-05-07 | Discharge: 2022-05-07 | Payer: MEDICARE | Primary: Family

## 2022-05-07 MED ADMIN — EUCALYPTUS-MENTHOL MM LOZG [83613]: 1 | ORAL | @ 03:00:00 | Stop: 2022-05-07 | NDC 12546062970

## 2022-05-07 MED ADMIN — IPRATROPIUM-ALBUTEROL 0.5 MG-3 MG(2.5 MG BASE)/3 ML IN NEBU [77459]: 3 mL | RESPIRATORY_TRACT | @ 17:00:00 | NDC 00487020101

## 2022-05-07 MED ADMIN — DOXYCYCLINE HYCLATE 100 MG PO TAB [2625]: 100 mg | ORAL | @ 03:00:00 | NDC 00904043004

## 2022-05-07 MED ADMIN — ERTAPENEM SYR [212030]: 500 mg | INTRAVENOUS | @ 03:00:00 | Stop: 2022-05-10 | NDC 60505619600

## 2022-05-07 MED ADMIN — IPRATROPIUM-ALBUTEROL 0.5 MG-3 MG(2.5 MG BASE)/3 ML IN NEBU [77459]: 3 mL | RESPIRATORY_TRACT | @ 22:00:00 | NDC 00378967131

## 2022-05-07 MED ADMIN — PREDNISONE 20 MG PO TAB [6496]: 40 mg | ORAL | @ 22:00:00 | NDC 00054001820

## 2022-05-07 MED ADMIN — IPRATROPIUM-ALBUTEROL 0.5 MG-3 MG(2.5 MG BASE)/3 ML IN NEBU [77459]: 3 mL | RESPIRATORY_TRACT | @ 13:00:00 | NDC 60687040579

## 2022-05-07 MED ADMIN — DOXYCYCLINE HYCLATE 100 MG PO TAB [2625]: 100 mg | ORAL | @ 14:00:00 | NDC 00904043004

## 2022-05-07 MED ADMIN — IPRATROPIUM-ALBUTEROL 0.5 MG-3 MG(2.5 MG BASE)/3 ML IN NEBU [77459]: 3 mL | RESPIRATORY_TRACT | @ 02:00:00 | NDC 00378967131

## 2022-05-08 MED ADMIN — IPRATROPIUM-ALBUTEROL 0.5 MG-3 MG(2.5 MG BASE)/3 ML IN NEBU [77459]: 3 mL | RESPIRATORY_TRACT | @ 13:00:00 | NDC 00378967131

## 2022-05-08 MED ADMIN — PREDNISONE 20 MG PO TAB [6496]: 40 mg | ORAL | @ 14:00:00 | Stop: 2022-05-08 | NDC 00054001820

## 2022-05-08 MED ADMIN — IPRATROPIUM-ALBUTEROL 0.5 MG-3 MG(2.5 MG BASE)/3 ML IN NEBU [77459]: 3 mL | RESPIRATORY_TRACT | @ 17:00:00 | NDC 00378967131

## 2022-05-08 MED ADMIN — PANTOPRAZOLE 40 MG PO TBEC [80436]: 40 mg | ORAL | @ 19:00:00 | NDC 00904647461

## 2022-05-08 MED ADMIN — ERTAPENEM SYR [212030]: 500 mg | INTRAVENOUS | @ 02:00:00 | Stop: 2022-05-10 | NDC 60505619600

## 2022-05-08 MED ADMIN — DOXYCYCLINE HYCLATE 100 MG PO TAB [2625]: 100 mg | ORAL | @ 02:00:00 | NDC 00904043004

## 2022-05-08 MED ADMIN — IPRATROPIUM-ALBUTEROL 0.5 MG-3 MG(2.5 MG BASE)/3 ML IN NEBU [77459]: 3 mL | RESPIRATORY_TRACT | @ 21:00:00 | NDC 00378967131

## 2022-05-08 MED ADMIN — DOXYCYCLINE HYCLATE 100 MG PO TAB [2625]: 100 mg | ORAL | @ 14:00:00 | NDC 00904043004

## 2022-05-08 MED ADMIN — PREDNISONE 10 MG PO TAB [6494]: 20 mg | ORAL | @ 22:00:00 | Stop: 2022-05-10 | NDC 00904692361

## 2022-05-08 MED ADMIN — CARVEDILOL 12.5 MG PO TAB [77424]: 25 mg | ORAL | @ 20:00:00 | Stop: 2022-05-08 | NDC 00904630261

## 2022-05-08 MED ADMIN — IPRATROPIUM-ALBUTEROL 0.5 MG-3 MG(2.5 MG BASE)/3 ML IN NEBU [77459]: 3 mL | RESPIRATORY_TRACT | @ 02:00:00 | NDC 00487020101

## 2022-05-08 MED ADMIN — CALCITRIOL 0.25 MCG PO CAP [9350]: 0.25 ug | ORAL | @ 19:00:00 | NDC 60687034511

## 2022-05-09 ENCOUNTER — Inpatient Hospital Stay: Admit: 2022-05-09 | Discharge: 2022-05-09 | Payer: MEDICARE | Primary: Family

## 2022-05-09 ENCOUNTER — Encounter: Admit: 2022-05-09 | Discharge: 2022-05-09 | Payer: MEDICARE | Primary: Family

## 2022-05-09 MED ADMIN — DIPHENHYDRAMINE HCL 50 MG PO CAP [2510]: 50 mg | ORAL | @ 13:00:00 | Stop: 2022-05-09 | NDC 00904205661

## 2022-05-09 MED ADMIN — FAMOTIDINE 20 MG PO TAB [10011]: 20 mg | ORAL | @ 13:00:00 | Stop: 2022-05-09 | NDC 63739064510

## 2022-05-09 MED ADMIN — IPRATROPIUM-ALBUTEROL 0.5 MG-3 MG(2.5 MG BASE)/3 ML IN NEBU [77459]: 3 mL | RESPIRATORY_TRACT | @ 14:00:00 | NDC 00487020101

## 2022-05-09 MED ADMIN — SODIUM CHLORIDE 0.9 % IV SOLP [27838]: 1000 mL | INTRAVENOUS | @ 16:00:00 | Stop: 2022-05-10 | NDC 00338004904

## 2022-05-09 MED ADMIN — IPRATROPIUM-ALBUTEROL 0.5 MG-3 MG(2.5 MG BASE)/3 ML IN NEBU [77459]: 3 mL | RESPIRATORY_TRACT | @ 18:00:00 | NDC 00378967131

## 2022-05-09 MED ADMIN — ASPIRIN 325 MG PO TAB [681]: 325 mg | ORAL | @ 13:00:00 | Stop: 2022-05-09 | NDC 66553000101

## 2022-05-09 MED ADMIN — DOXYCYCLINE HYCLATE 100 MG PO TAB [2625]: 100 mg | ORAL | @ 03:00:00 | NDC 00904043004

## 2022-05-09 MED ADMIN — CARVEDILOL 12.5 MG PO TAB [77424]: 12.5 mg | ORAL | @ 03:00:00 | NDC 00904630261

## 2022-05-09 MED ADMIN — CARVEDILOL 12.5 MG PO TAB [77424]: 12.5 mg | ORAL | @ 13:00:00 | NDC 00904630261

## 2022-05-09 MED ADMIN — AMLODIPINE 10 MG PO TAB [80302]: 10 mg | ORAL | @ 20:00:00 | NDC 00904637161

## 2022-05-09 MED ADMIN — DOXYCYCLINE HYCLATE 100 MG PO TAB [2625]: 100 mg | ORAL | @ 13:00:00 | NDC 00904043004

## 2022-05-09 MED ADMIN — PREDNISONE 10 MG PO TAB [6494]: 20 mg | ORAL | @ 13:00:00 | Stop: 2022-05-09 | NDC 00904692361

## 2022-05-09 MED ADMIN — PANTOPRAZOLE 40 MG PO TBEC [80436]: 40 mg | ORAL | @ 13:00:00 | NDC 00904647461

## 2022-05-09 MED ADMIN — ERTAPENEM SYR [212030]: 500 mg | INTRAVENOUS | @ 03:00:00 | Stop: 2022-05-09 | NDC 60505619600

## 2022-05-09 MED ADMIN — PREDNISONE 20 MG PO TAB [6496]: 40 mg | ORAL | @ 16:00:00 | Stop: 2022-05-09 | NDC 00054001820

## 2022-05-09 MED ADMIN — CALCITRIOL 0.25 MCG PO CAP [9350]: 0.25 ug | ORAL | @ 13:00:00 | NDC 60687034511

## 2022-05-09 MED ADMIN — IPRATROPIUM-ALBUTEROL 0.5 MG-3 MG(2.5 MG BASE)/3 ML IN NEBU [77459]: 3 mL | RESPIRATORY_TRACT | @ 02:00:00 | NDC 00487020101

## 2022-05-10 ENCOUNTER — Inpatient Hospital Stay: Admit: 2022-05-10 | Discharge: 2022-05-10 | Payer: MEDICARE | Primary: Family

## 2022-05-10 ENCOUNTER — Encounter: Admit: 2022-05-10 | Discharge: 2022-05-10 | Payer: MEDICARE | Primary: Family

## 2022-05-10 DIAGNOSIS — I10 Essential (primary) hypertension: Secondary | ICD-10-CM

## 2022-05-10 DIAGNOSIS — K5792 Diverticulitis of intestine, part unspecified, without perforation or abscess without bleeding: Secondary | ICD-10-CM

## 2022-05-10 DIAGNOSIS — G35 Multiple sclerosis: Secondary | ICD-10-CM

## 2022-05-10 DIAGNOSIS — F319 Bipolar disorder, unspecified: Secondary | ICD-10-CM

## 2022-05-10 DIAGNOSIS — R918 Other nonspecific abnormal finding of lung field: Secondary | ICD-10-CM

## 2022-05-10 DIAGNOSIS — N19 Unspecified kidney failure: Secondary | ICD-10-CM

## 2022-05-10 DIAGNOSIS — E785 Hyperlipidemia, unspecified: Secondary | ICD-10-CM

## 2022-05-10 DIAGNOSIS — I701 Atherosclerosis of renal artery: Secondary | ICD-10-CM

## 2022-05-10 DIAGNOSIS — F32A Depression: Secondary | ICD-10-CM

## 2022-05-10 DIAGNOSIS — T50905A Adverse effect of unspecified drugs, medicaments and biological substances, initial encounter: Secondary | ICD-10-CM

## 2022-05-10 DIAGNOSIS — N390 Urinary tract infection, site not specified: Secondary | ICD-10-CM

## 2022-05-10 MED ADMIN — CARVEDILOL 12.5 MG PO TAB [77424]: 12.5 mg | ORAL | NDC 00904630261

## 2022-05-10 MED ADMIN — CARVEDILOL 12.5 MG PO TAB [77424]: 12.5 mg | ORAL | @ 10:00:00 | NDC 00904630261

## 2022-05-10 MED ADMIN — AMLODIPINE 10 MG PO TAB [80302]: 10 mg | ORAL | @ 17:00:00 | NDC 00904637161

## 2022-05-10 MED ADMIN — CLOPIDOGREL 75 MG PO TAB [78966]: 75 mg | ORAL | @ 17:00:00 | NDC 00904629461

## 2022-05-10 MED ADMIN — ERTAPENEM SYR [212030]: 500 mg | INTRAVENOUS | @ 02:00:00 | NDC 60505619600

## 2022-05-10 MED ADMIN — HYDRALAZINE 10 MG PO TAB [3698]: 10 mg | ORAL | @ 03:00:00 | Stop: 2022-05-10 | NDC 00904644061

## 2022-05-10 MED ADMIN — DOXYCYCLINE HYCLATE 100 MG PO TAB [2625]: 100 mg | ORAL | @ 02:00:00 | NDC 00904043004

## 2022-05-10 MED ADMIN — ACETAMINOPHEN 325 MG PO TAB [101]: 650 mg | ORAL | NDC 00904677361

## 2022-05-10 MED ADMIN — IPRATROPIUM-ALBUTEROL 0.5 MG-3 MG(2.5 MG BASE)/3 ML IN NEBU [77459]: 3 mL | RESPIRATORY_TRACT | @ 02:00:00 | NDC 00487020101

## 2022-05-10 MED ADMIN — DOXYCYCLINE HYCLATE 100 MG PO TAB [2625]: 100 mg | ORAL | @ 17:00:00 | NDC 00904043004

## 2022-05-10 MED ADMIN — IPRATROPIUM-ALBUTEROL 0.5 MG-3 MG(2.5 MG BASE)/3 ML IN NEBU [77459]: 3 mL | RESPIRATORY_TRACT | @ 17:00:00 | NDC 00378967131

## 2022-05-10 MED ADMIN — PANTOPRAZOLE 40 MG PO TBEC [80436]: 40 mg | ORAL | @ 17:00:00 | Stop: 2022-05-10 | NDC 00904647461

## 2022-05-10 MED ADMIN — LIDOCAINE (PF) 10 MG/ML (1 %) IJ SOLN [95838]: 2 mL | INTRADERMAL | @ 13:00:00 | Stop: 2022-05-10 | NDC 63323049204

## 2022-05-10 MED ADMIN — PREDNISONE 20 MG PO TAB [6496]: 40 mg | ORAL | @ 23:00:00 | Stop: 2022-05-11 | NDC 00054001820

## 2022-05-10 MED ADMIN — CALCITRIOL 0.25 MCG PO CAP [9350]: 0.25 ug | ORAL | @ 17:00:00 | NDC 60687034511

## 2022-05-10 MED ADMIN — SODIUM CHLORIDE 0.9 % IV SOLP [27838]: 300 mL | INTRAVENOUS | @ 13:00:00 | Stop: 2022-05-10 | NDC 00338004904

## 2022-05-11 ENCOUNTER — Encounter: Admit: 2022-05-11 | Discharge: 2022-05-11 | Payer: MEDICARE | Primary: Family

## 2022-05-11 MED ADMIN — CARVEDILOL 12.5 MG PO TAB [77424]: 12.5 mg | ORAL | @ 02:00:00 | NDC 00904630261

## 2022-05-11 MED ADMIN — IPRATROPIUM-ALBUTEROL 0.5 MG-3 MG(2.5 MG BASE)/3 ML IN NEBU [77459]: 3 mL | RESPIRATORY_TRACT | @ 21:00:00 | NDC 00487020101

## 2022-05-11 MED ADMIN — DOXYCYCLINE HYCLATE 100 MG PO TAB [2625]: 100 mg | ORAL | @ 13:00:00 | NDC 00904043004

## 2022-05-11 MED ADMIN — DOXYCYCLINE HYCLATE 100 MG PO TAB [2625]: 100 mg | ORAL | @ 02:00:00 | NDC 00904043004

## 2022-05-11 MED ADMIN — CLOPIDOGREL 75 MG PO TAB [78966]: 75 mg | ORAL | @ 13:00:00 | NDC 00904629461

## 2022-05-11 MED ADMIN — CALCITRIOL 0.25 MCG PO CAP [9350]: 0.25 ug | ORAL | @ 13:00:00 | NDC 60687034511

## 2022-05-11 MED ADMIN — IPRATROPIUM-ALBUTEROL 0.5 MG-3 MG(2.5 MG BASE)/3 ML IN NEBU [77459]: 3 mL | RESPIRATORY_TRACT | @ 19:00:00 | NDC 00378967131

## 2022-05-11 MED ADMIN — IPRATROPIUM-ALBUTEROL 0.5 MG-3 MG(2.5 MG BASE)/3 ML IN NEBU [77459]: 3 mL | RESPIRATORY_TRACT | @ 16:00:00 | NDC 00378967131

## 2022-05-11 MED ADMIN — PANTOPRAZOLE 40 MG PO TBEC [80436]: 40 mg | ORAL | @ 02:00:00 | NDC 00904647461

## 2022-05-11 MED ADMIN — ERTAPENEM SYR [212030]: 500 mg | INTRAVENOUS | @ 02:00:00 | NDC 60505619600

## 2022-05-11 MED ADMIN — CARVEDILOL 12.5 MG PO TAB [77424]: 12.5 mg | ORAL | @ 14:00:00 | NDC 00904630261

## 2022-05-11 MED ADMIN — IPRATROPIUM-ALBUTEROL 0.5 MG-3 MG(2.5 MG BASE)/3 ML IN NEBU [77459]: 3 mL | RESPIRATORY_TRACT | @ 02:00:00 | NDC 00487020101

## 2022-05-11 MED ADMIN — AMLODIPINE 10 MG PO TAB [80302]: 10 mg | ORAL | @ 13:00:00 | NDC 00904637161

## 2022-05-11 MED ADMIN — PANTOPRAZOLE 40 MG PO TBEC [80436]: 40 mg | ORAL | @ 18:00:00 | NDC 00904647461

## 2022-05-12 ENCOUNTER — Encounter: Admit: 2022-05-12 | Discharge: 2022-05-12 | Payer: MEDICARE | Primary: Family

## 2022-05-12 ENCOUNTER — Inpatient Hospital Stay: Admit: 2022-05-12 | Discharge: 2022-05-12 | Payer: MEDICARE | Primary: Family

## 2022-05-12 MED ADMIN — IPRATROPIUM-ALBUTEROL 0.5 MG-3 MG(2.5 MG BASE)/3 ML IN NEBU [77459]: 3 mL | RESPIRATORY_TRACT | @ 03:00:00 | NDC 00378967131

## 2022-05-12 MED ADMIN — ERTAPENEM SYR [212030]: 500 mg | INTRAVENOUS | @ 02:00:00 | NDC 60505619600

## 2022-05-12 MED ADMIN — SODIUM CHLORIDE 0.9 % IV SOLP [27838]: 300 mL | INTRAVENOUS | @ 18:00:00 | Stop: 2022-05-12 | NDC 00338004904

## 2022-05-12 MED ADMIN — AMLODIPINE 10 MG PO TAB [80302]: 10 mg | ORAL | @ 14:00:00 | NDC 00904637161

## 2022-05-12 MED ADMIN — CALCITRIOL 0.25 MCG PO CAP [9350]: 0.25 ug | ORAL | @ 14:00:00 | NDC 60687034511

## 2022-05-12 MED ADMIN — PANTOPRAZOLE 40 MG PO TBEC [80436]: 40 mg | ORAL | @ 02:00:00 | NDC 00904647461

## 2022-05-12 MED ADMIN — CARVEDILOL 12.5 MG PO TAB [77424]: 12.5 mg | ORAL | @ 14:00:00 | NDC 00904630261

## 2022-05-12 MED ADMIN — DOXYCYCLINE HYCLATE 100 MG PO TAB [2625]: 100 mg | ORAL | @ 02:00:00 | NDC 00904043004

## 2022-05-12 MED ADMIN — CLOPIDOGREL 75 MG PO TAB [78966]: 75 mg | ORAL | @ 14:00:00 | NDC 00904629461

## 2022-05-12 MED ADMIN — PANTOPRAZOLE 40 MG PO TBEC [80436]: 40 mg | ORAL | @ 15:00:00 | NDC 00904647461

## 2022-05-12 MED ADMIN — CARVEDILOL 12.5 MG PO TAB [77424]: 12.5 mg | ORAL | @ 02:00:00 | NDC 00904630261

## 2022-05-12 MED ADMIN — IPRATROPIUM-ALBUTEROL 0.5 MG-3 MG(2.5 MG BASE)/3 ML IN NEBU [77459]: 3 mL | RESPIRATORY_TRACT | @ 14:00:00 | NDC 00378967131

## 2022-05-12 MED ADMIN — DOXYCYCLINE HYCLATE 100 MG PO TAB [2625]: 100 mg | ORAL | @ 14:00:00 | NDC 00904043004

## 2022-05-12 MED ADMIN — LIDOCAINE (PF) 10 MG/ML (1 %) IJ SOLN [95838]: 2 mL | INTRADERMAL | @ 18:00:00 | Stop: 2022-05-12 | NDC 63323049204

## 2022-05-13 ENCOUNTER — Encounter: Admit: 2022-05-13 | Discharge: 2022-05-13 | Payer: MEDICARE | Primary: Family

## 2022-05-13 ENCOUNTER — Inpatient Hospital Stay: Admit: 2022-05-13 | Discharge: 2022-05-13 | Payer: MEDICARE | Primary: Family

## 2022-05-13 DIAGNOSIS — I2510 Atherosclerotic heart disease of native coronary artery without angina pectoris: Secondary | ICD-10-CM

## 2022-05-13 MED ADMIN — PANTOPRAZOLE 40 MG PO TBEC [80436]: 40 mg | ORAL | @ 02:00:00 | NDC 00904647461

## 2022-05-13 MED ADMIN — IPRATROPIUM-ALBUTEROL 0.5 MG-3 MG(2.5 MG BASE)/3 ML IN NEBU [77459]: 3 mL | RESPIRATORY_TRACT | @ 14:00:00 | Stop: 2022-05-13 | NDC 00378967131

## 2022-05-13 MED ADMIN — PANTOPRAZOLE 40 MG PO TBEC [80436]: 40 mg | ORAL | @ 16:00:00 | Stop: 2022-05-13 | NDC 00904647461

## 2022-05-13 MED ADMIN — ATORVASTATIN 40 MG PO TAB [77113]: 80 mg | ORAL | @ 02:00:00 | NDC 00904629261

## 2022-05-13 MED ADMIN — ERTAPENEM SYR [212030]: 500 mg | INTRAVENOUS | @ 02:00:00 | NDC 60505619600

## 2022-05-13 MED ADMIN — DOXYCYCLINE HYCLATE 100 MG PO TAB [2625]: 100 mg | ORAL | @ 02:00:00 | NDC 00904043004

## 2022-05-13 MED ADMIN — CALCITRIOL 0.25 MCG PO CAP [9350]: 0.25 ug | ORAL | @ 15:00:00 | Stop: 2022-05-13 | NDC 60687034511

## 2022-05-13 MED ADMIN — CLOPIDOGREL 75 MG PO TAB [78966]: 75 mg | ORAL | @ 15:00:00 | Stop: 2022-05-13 | NDC 00904629461

## 2022-05-13 MED ADMIN — IPRATROPIUM-ALBUTEROL 0.5 MG-3 MG(2.5 MG BASE)/3 ML IN NEBU [77459]: 3 mL | RESPIRATORY_TRACT | @ 03:00:00 | NDC 00378967131

## 2022-05-13 MED ADMIN — AMLODIPINE 10 MG PO TAB [80302]: 10 mg | ORAL | @ 15:00:00 | Stop: 2022-05-13 | NDC 00904637161

## 2022-05-13 MED ADMIN — DOXYCYCLINE HYCLATE 100 MG PO TAB [2625]: 100 mg | ORAL | @ 15:00:00 | Stop: 2022-05-13 | NDC 00904043004

## 2022-05-13 MED ADMIN — CARVEDILOL 12.5 MG PO TAB [77424]: 12.5 mg | ORAL | @ 02:00:00 | NDC 00904630261

## 2022-05-13 MED ADMIN — IPRATROPIUM-ALBUTEROL 0.5 MG-3 MG(2.5 MG BASE)/3 ML IN NEBU [77459]: 3 mL | RESPIRATORY_TRACT | @ 18:00:00 | Stop: 2022-05-13 | NDC 00378967131

## 2022-05-13 MED ADMIN — CARVEDILOL 12.5 MG PO TAB [77424]: 12.5 mg | ORAL | @ 15:00:00 | Stop: 2022-05-13 | NDC 00904630261

## 2022-05-13 MED FILL — GABAPENTIN 300 MG PO CAP: 300 mg | ORAL | 30 days supply | Qty: 30 | Fill #1 | Status: CP

## 2022-05-13 MED FILL — CLOPIDOGREL 75 MG PO TAB: 75 mg | ORAL | 90 days supply | Qty: 90 | Fill #1 | Status: CP

## 2022-05-13 MED FILL — PANTOPRAZOLE 40 MG PO TBEC: 40 mg | ORAL | 14 days supply | Qty: 28 | Fill #1 | Status: CP

## 2022-05-13 MED FILL — CLINDAMYCIN HCL 150 MG PO CAP: 150 mg | ORAL | 6 days supply | Qty: 54 | Fill #1 | Status: CP

## 2022-05-13 MED FILL — ATORVASTATIN 80 MG PO TAB: 80 mg | ORAL | 90 days supply | Qty: 90 | Fill #1 | Status: CP

## 2022-05-16 ENCOUNTER — Encounter: Admit: 2022-05-16 | Discharge: 2022-05-16 | Payer: MEDICARE | Primary: Family

## 2022-05-16 NOTE — Telephone Encounter
Patient Discharge Date from hospital: 05/13/22  Date Call Attempted: 05/16/22  Number of Attempts: 1  Date Call Completed:  05/16/22      Two Patient Identifier complete: Yes [x]     Next Appointment    Next follow-up appointment on 05/19/22 at 1100 with Bunnie Philips, APRN    Transportation    Does pt have transportation?  Yes [x]     No []    NA []      Home Health    No    Medications    Does pt have all medications? Yes  [x]     No []       START taking:  clindamycin HCL (Cleocin HCL)  clopiDOGreL (PLAVIX)  Start taking on: May 14, 2022  polyethylene glycol 3350 (MIRALAX)  CHANGE how you take:  atorvastatin (LIPITOR)  carvediloL (COREG)  gabapentin (NEURONTIN)  pantoprazole DR (PROTONIX)  senna (SENOKOT)  sertraline (ZOLOFT)  STOP taking:  duloxetine DR 60 mg capsule (CYMBALTA)  hydrALAZINE 50 mg tablet (APRESOLINE)  isosorbide mononitrate ER 60 mg ER tablet (IMDUR)  torsemide 10 mg tablet (DEMADEX)    Diet    200 mg cholesterol, 2 G Na    Is patient following prescribed diet and restrictions?  Yes [x]    No []      Scale/Weight    Does pt have a scale at home?  Yes [x]    No []     Did pt weight first thing this morning?  Yes []    No [x]      If yes, what was pt's first morning weight today? Education provided    Signs and Symptoms    Pt reports the following symptoms:     No BLE edema or upper abdominal bloating/chest tightness. Pt states SOA is better since admission.       Pt verbalized understanding of signs and symptoms of HF and when to contact a provider or seek immediate assistance at the ER.    Was pt given zone sheet? Yes []   No [x]     Intervention(s)    Pt educated on the importance of weighing daily first thing in the morning before dressing, before eating or drinking, and after voiding using the same scale in the same location and write results down in note pad or log. Notify us for weight gains of 3 lbs in one day or 5 lbs in one week. Notify your provider for increased SOA.  Notify your provider for swelling or increased swelling in BLE or abdominal fullness/bloating. Advised to check B/P at least once daily. Check 1-2 hours after am meds. Log results. Check B/P other times if feeling lightheaded, dizzy, or if you feel your heart rate is elevated. Document the time you checked and any symptoms you may be feeling at the time. Call 911 for sudden, severe chest/pain pressure/SOA develops. Be sure to keep your follow up appointment and bring your weight logs, B/P logs, and medication list with you to your appointment. Call us at [phone] if you have any questions.       Driving Restrictions  *You may return to work/school in 2 days.  *NO lifting greater than 15 pounds for 1 week after the cardiac catheterization procedure.  *NO strenuous or sexual activity for 1 week. Limit going up stairs and stooping down.  *NO driving for 2 days.  *Call if there is an increase in pain, swelling, or redness around your groin access site.  *DO NOT soak groin incision in water.  *NO tub baths, hot  tubs, or swimming for 1 week.  *You may shower after discharge.  Please contact your doctor if you have any of the following symptoms: Chest pain, shortness of breath,  lightheadedness, dizziness, near fainting, palpitations, back pain, abdominal pain, or bleeding.      Plan of Care    Continued education needed for heart failure symptom management and when to contact our office.

## 2022-05-18 ENCOUNTER — Encounter: Admit: 2022-05-18 | Discharge: 2022-05-18 | Payer: MEDICARE | Primary: Family

## 2022-05-19 ENCOUNTER — Encounter: Admit: 2022-05-19 | Discharge: 2022-05-19 | Payer: MEDICARE | Primary: Family

## 2022-05-31 ENCOUNTER — Encounter: Admit: 2022-05-31 | Discharge: 2022-05-31 | Payer: MEDICARE | Primary: Family

## 2022-06-02 ENCOUNTER — Encounter: Admit: 2022-06-02 | Discharge: 2022-06-02 | Payer: MEDICARE | Primary: Family

## 2022-06-03 ENCOUNTER — Encounter: Admit: 2022-06-03 | Discharge: 2022-06-03 | Payer: MEDICARE | Primary: Family

## 2022-06-08 ENCOUNTER — Encounter: Admit: 2022-06-08 | Discharge: 2022-06-08 | Payer: MEDICARE | Primary: Family

## 2022-06-14 ENCOUNTER — Encounter: Admit: 2022-06-14 | Discharge: 2022-06-14 | Payer: MEDICARE | Primary: Family

## 2022-06-22 ENCOUNTER — Encounter: Admit: 2022-06-22 | Discharge: 2022-06-22 | Payer: MEDICAID | Primary: Family

## 2022-06-22 ENCOUNTER — Encounter: Admit: 2022-06-22 | Discharge: 2022-06-22 | Payer: MEDICARE | Primary: Family

## 2022-07-05 ENCOUNTER — Encounter: Admit: 2022-07-05 | Discharge: 2022-07-05 | Payer: MEDICAID | Primary: Family

## 2022-07-06 ENCOUNTER — Encounter: Admit: 2022-07-06 | Discharge: 2022-07-06 | Payer: MEDICAID | Primary: Family

## 2022-07-17 ENCOUNTER — Encounter: Admit: 2022-07-17 | Discharge: 2022-07-17 | Payer: MEDICAID | Primary: Family

## 2022-07-17 ENCOUNTER — Observation Stay: Admit: 2022-07-17 | Discharge: 2022-07-17 | Payer: MEDICARE | Primary: Family

## 2022-07-17 ENCOUNTER — Observation Stay: Admit: 2022-07-17 | Discharge: 2022-07-17 | Payer: MEDICAID | Primary: Family

## 2022-07-17 ENCOUNTER — Emergency Department: Admit: 2022-07-17 | Discharge: 2022-07-17 | Payer: MEDICAID

## 2022-07-17 DIAGNOSIS — N19 Unspecified kidney failure: Secondary | ICD-10-CM

## 2022-07-17 DIAGNOSIS — N390 Urinary tract infection, site not specified: Secondary | ICD-10-CM

## 2022-07-17 DIAGNOSIS — T50905A Adverse effect of unspecified drugs, medicaments and biological substances, initial encounter: Secondary | ICD-10-CM

## 2022-07-17 DIAGNOSIS — I701 Atherosclerosis of renal artery: Secondary | ICD-10-CM

## 2022-07-17 DIAGNOSIS — E8770 Fluid overload, unspecified: Secondary | ICD-10-CM

## 2022-07-17 DIAGNOSIS — E785 Hyperlipidemia, unspecified: Secondary | ICD-10-CM

## 2022-07-17 DIAGNOSIS — F32A Depression: Secondary | ICD-10-CM

## 2022-07-17 DIAGNOSIS — G35 Multiple sclerosis: Secondary | ICD-10-CM

## 2022-07-17 DIAGNOSIS — K5792 Diverticulitis of intestine, part unspecified, without perforation or abscess without bleeding: Secondary | ICD-10-CM

## 2022-07-17 DIAGNOSIS — I10 Essential (primary) hypertension: Secondary | ICD-10-CM

## 2022-07-17 DIAGNOSIS — R918 Other nonspecific abnormal finding of lung field: Secondary | ICD-10-CM

## 2022-07-17 DIAGNOSIS — R0602 Shortness of breath: Secondary | ICD-10-CM

## 2022-07-17 DIAGNOSIS — F319 Bipolar disorder, unspecified: Secondary | ICD-10-CM

## 2022-07-17 LAB — COMPREHENSIVE METABOLIC PANEL
ALBUMIN: 3.8 g/dL (ref 3.5–5.0)
ALK PHOSPHATASE: 183 U/L — ABNORMAL HIGH (ref 25–110)
ALT: 8 U/L (ref 7–56)
ANION GAP: 16 K/UL — ABNORMAL HIGH (ref 3–12)
AST: 14 U/L (ref 7–40)
BLD UREA NITROGEN: 78 mg/dL — ABNORMAL HIGH (ref 7–25)
CALCIUM: 7.5 mg/dL — ABNORMAL LOW (ref 8.5–10.6)
CHLORIDE: 108 MMOL/L (ref 98–110)
CO2: 17 MMOL/L — ABNORMAL LOW (ref 21–30)
CREATININE: 9.3 mg/dL — ABNORMAL HIGH (ref 0.4–1.00)
EGFR: 5 mL/min — ABNORMAL LOW (ref >60)
GLUCOSE,PANEL: 91 mg/dL (ref 70–100)
SODIUM: 141 MMOL/L — ABNORMAL LOW (ref 137–147)
TOTAL BILIRUBIN: 0.3 mg/dL (ref 0.3–1.2)
TOTAL PROTEIN: 6.9 g/dL (ref 6.0–8.0)

## 2022-07-17 LAB — CBC AND DIFF
ABSOLUTE BASO COUNT: 0 K/UL (ref 0–0.20)
ABSOLUTE EOS COUNT: 0.1 K/UL (ref 0–0.45)
MDW (MONOCYTE DISTRIBUTION WIDTH): 18 (ref <20.7)
RBC COUNT: 2.8 M/UL — ABNORMAL LOW (ref 4.0–5.0)
WBC COUNT: 6.2 K/UL (ref 4.5–11.0)

## 2022-07-17 LAB — HIGH SENSITIVITY TROPONIN I 0 HOUR: HIGH SENSITIVITY TROPONIN I 0 HOUR: 59 ng/L — ABNORMAL HIGH (ref <12)

## 2022-07-17 LAB — POC POTASSIUM: POTASSIUM, POC: 3.4 MMOL/L — ABNORMAL LOW (ref 3.5–5.1)

## 2022-07-17 LAB — HEPATITIS B SURFACE AG

## 2022-07-17 LAB — HEPATITIS B SURFACE AB: HEP B SURFACE ABY: NEGATIVE

## 2022-07-17 LAB — POC BLOOD GAS VEN
BICARB, VEN POC: 17 MMOL/L
PCO2, VEN POC: 35 mmHg — ABNORMAL LOW (ref 36–50)
PH, VEN POC: 7.3 (ref 7.30–7.40)

## 2022-07-17 LAB — INFLUENZA A/B AND RSV PCR
FLU A: NEGATIVE
FLU B: NEGATIVE
RSV: NEGATIVE

## 2022-07-17 LAB — POC HEMATOCRIT
HEMATOCRIT POC: 24 % — ABNORMAL LOW (ref 36–45)
HEMOGLOBIN POC: 8.2 g/dL — ABNORMAL LOW (ref 12.0–15.0)

## 2022-07-17 LAB — HEPATITIS B CORE AB TOT (IGG+IGM)

## 2022-07-17 LAB — POC SODIUM: SODIUM, POC: 141 MMOL/L (ref 137–147)

## 2022-07-17 LAB — COVID-19 (SARS-COV-2) PCR

## 2022-07-17 LAB — MAGNESIUM: MAGNESIUM: 2.1 mg/dL — ABNORMAL LOW (ref 1.6–2.6)

## 2022-07-17 LAB — HIGH SENSITIVITY TROPONIN I 2 HOUR: HIGH SENSITIVITY TROPONIN I 2 HOUR: 48 ng/L — ABNORMAL HIGH (ref <12)

## 2022-07-17 MED ORDER — SENNOSIDES 8.6 MG PO TAB
1 | Freq: Two times a day (BID) | ORAL | 0 refills | Status: AC
Start: 2022-07-17 — End: ?

## 2022-07-17 MED ORDER — ONDANSETRON HCL (PF) 4 MG/2 ML IJ SOLN
4 mg | INTRAVENOUS | 0 refills | PRN
Start: 2022-07-17 — End: ?

## 2022-07-17 MED ORDER — POLYETHYLENE GLYCOL 3350 17 GRAM PO PWPK
17 g | Freq: Every day | ORAL | 0 refills | Status: AC | PRN
Start: 2022-07-17 — End: ?

## 2022-07-17 MED ORDER — CHOLECALCIFEROL (VITAMIN D3) 25 MCG (1,000 UNIT) PO TAB
2000 [IU] | Freq: Every day | ORAL | 0 refills | Status: AC
Start: 2022-07-17 — End: ?
  Administered 2022-07-18 (×2): 2000 [IU] via ORAL

## 2022-07-17 MED ORDER — PRAZOSIN 1 MG PO CAP
1 mg | Freq: Every evening | ORAL | 0 refills | Status: AC
Start: 2022-07-17 — End: ?
  Administered 2022-07-18: 03:00:00 1 mg via ORAL

## 2022-07-17 MED ORDER — ASPIRIN 81 MG PO TBEC
81 mg | Freq: Every day | ORAL | 0 refills | Status: AC
Start: 2022-07-17 — End: ?
  Administered 2022-07-18 (×2): 81 mg via ORAL

## 2022-07-17 MED ORDER — TRAZODONE 50 MG PO TAB
50 mg | Freq: Every evening | ORAL | 0 refills | Status: AC | PRN
Start: 2022-07-17 — End: ?
  Administered 2022-07-18: 03:00:00 50 mg via ORAL

## 2022-07-17 MED ORDER — FUROSEMIDE 10 MG/ML IJ SOLN
40 mg | Freq: Once | INTRAVENOUS | 0 refills | Status: CP
Start: 2022-07-17 — End: ?
  Administered 2022-07-17: 21:00:00 40 mg via INTRAVENOUS

## 2022-07-17 MED ORDER — SODIUM CHLORIDE 0.9 % IV SOLP
2000 mL | INTRAVENOUS | 0 refills | PRN
Start: 2022-07-17 — End: ?

## 2022-07-17 MED ORDER — ONDANSETRON HCL (PF) 4 MG/2 ML IJ SOLN
4 mg | Freq: Once | INTRAVENOUS | 0 refills | Status: CP
Start: 2022-07-17 — End: ?
  Administered 2022-07-17: 19:00:00 4 mg via INTRAVENOUS

## 2022-07-17 MED ORDER — ONDANSETRON 4 MG PO TBDI
4 mg | ORAL | 0 refills | PRN
Start: 2022-07-17 — End: ?

## 2022-07-17 MED ORDER — ACETAMINOPHEN 500 MG PO TAB
500 mg | ORAL | 0 refills | PRN
Start: 2022-07-17 — End: ?

## 2022-07-17 MED ORDER — SODIUM CHLORIDE 0.9 % IV SOLP
100 mL | INTRAVENOUS | 0 refills | Status: AC | PRN
Start: 2022-07-17 — End: ?

## 2022-07-17 MED ORDER — AMLODIPINE 5 MG PO TAB
10 mg | Freq: Every day | ORAL | 0 refills | Status: AC
Start: 2022-07-17 — End: ?
  Administered 2022-07-18: 15:00:00 10 mg via ORAL

## 2022-07-17 MED ORDER — CYCLOBENZAPRINE 10 MG PO TAB
5 mg | Freq: Three times a day (TID) | ORAL | 0 refills | Status: AC | PRN
Start: 2022-07-17 — End: ?

## 2022-07-17 MED ORDER — LIDOCAINE (PF) 10 MG/ML (1 %) IJ SOLN
2 mL | Freq: Once | INTRADERMAL | 0 refills | Status: AC | PRN
Start: 2022-07-17 — End: ?

## 2022-07-17 MED ORDER — SODIUM CHLORIDE 0.9 % IV SOLP
300 mL | INTRAVENOUS | 0 refills | Status: CP | PRN
Start: 2022-07-17 — End: ?
  Administered 2022-07-17: 23:00:00 300 mL via INTRAVENOUS

## 2022-07-17 MED ORDER — ALBUTEROL SULFATE 90 MCG/ACTUATION IN HFAA
2 | Freq: Three times a day (TID) | RESPIRATORY_TRACT | 0 refills | Status: AC | PRN
Start: 2022-07-17 — End: ?
  Administered 2022-07-18: 03:00:00 2 via RESPIRATORY_TRACT

## 2022-07-17 MED ORDER — NITROGLYCERIN IN 5 % DEXTROSE 50 MG/250 ML (200 MCG/ML) IV SOLN
.1-3 ug/kg/min | INTRAVENOUS | 0 refills | Status: AC
Start: 2022-07-17 — End: ?
  Administered 2022-07-17: 20:00:00 0.1 ug/kg/min via INTRAVENOUS

## 2022-07-17 MED ORDER — SERTRALINE 100 MG PO TAB
100 mg | Freq: Every day | ORAL | 0 refills | Status: AC
Start: 2022-07-17 — End: ?
  Administered 2022-07-18 (×2): 100 mg via ORAL

## 2022-07-17 MED ORDER — CARVEDILOL 12.5 MG PO TAB
12.5 mg | Freq: Two times a day (BID) | ORAL | 0 refills | Status: AC
Start: 2022-07-17 — End: ?
  Administered 2022-07-18 (×2): 12.5 mg via ORAL

## 2022-07-17 MED ORDER — LORATADINE 10 MG PO TAB
5 mg | Freq: Every day | ORAL | 0 refills | Status: AC
Start: 2022-07-17 — End: ?
  Administered 2022-07-18 (×2): 5 mg via ORAL

## 2022-07-17 MED ORDER — PANTOPRAZOLE 40 MG PO TBEC
40 mg | Freq: Two times a day (BID) | ORAL | 0 refills | Status: AC
Start: 2022-07-17 — End: ?
  Administered 2022-07-18 (×2): 40 mg via ORAL

## 2022-07-17 MED ORDER — ATORVASTATIN 40 MG PO TAB
80 mg | Freq: Every evening | ORAL | 0 refills | Status: AC
Start: 2022-07-17 — End: ?
  Administered 2022-07-18: 03:00:00 80 mg via ORAL

## 2022-07-17 MED ORDER — CALCITRIOL 0.25 MCG PO CAP
.25 ug | Freq: Every day | ORAL | 0 refills | Status: AC
Start: 2022-07-17 — End: ?
  Administered 2022-07-18 (×2): 0.25 ug via ORAL

## 2022-07-17 MED ORDER — POLYETHYLENE GLYCOL 3350 17 GRAM PO PWPK
1 | Freq: Every day | ORAL | 0 refills | PRN
Start: 2022-07-17 — End: ?

## 2022-07-17 MED ORDER — MELATONIN 5 MG PO TAB
5 mg | Freq: Every evening | ORAL | 0 refills | PRN
Start: 2022-07-17 — End: ?

## 2022-07-17 MED ORDER — SODIUM CHLORIDE 0.9 % IV SOLP
300 mL | INTRAVENOUS | 0 refills | PRN
Start: 2022-07-17 — End: ?

## 2022-07-17 MED ORDER — HEPARIN, PORCINE (PF) 5,000 UNIT/0.5 ML IJ SYRG
5000 [IU] | SUBCUTANEOUS | 0 refills
Start: 2022-07-17 — End: ?

## 2022-07-17 MED ORDER — SENNOSIDES-DOCUSATE SODIUM 8.6-50 MG PO TAB
1 | Freq: Every day | ORAL | 0 refills | PRN
Start: 2022-07-17 — End: ?

## 2022-07-17 MED ORDER — LIDOCAINE (PF) 10 MG/ML (1 %) IJ SOLN
2 mL | Freq: Once | INTRADERMAL | 0 refills | PRN
Start: 2022-07-17 — End: ?

## 2022-07-17 MED ORDER — HEPARIN, PORCINE (PF) 5,000 UNIT/0.5 ML IJ SYRG
5000 [IU] | SUBCUTANEOUS | 0 refills | Status: AC
Start: 2022-07-17 — End: ?
  Administered 2022-07-18 (×2): 5000 [IU] via SUBCUTANEOUS

## 2022-07-17 MED ORDER — CLOPIDOGREL 75 MG PO TAB
75 mg | Freq: Every day | ORAL | 0 refills | Status: AC
Start: 2022-07-17 — End: ?
  Administered 2022-07-18 (×2): 75 mg via ORAL

## 2022-07-17 MED ORDER — SODIUM CHLORIDE 0.9 % IV SOLP
2000 mL | INTRAVENOUS | 0 refills | Status: AC | PRN
Start: 2022-07-17 — End: ?

## 2022-07-17 MED ORDER — GABAPENTIN 300 MG PO CAP
300 mg | Freq: Every evening | ORAL | 0 refills | Status: AC
Start: 2022-07-17 — End: ?
  Administered 2022-07-18: 03:00:00 300 mg via ORAL

## 2022-07-17 MED ORDER — SODIUM CHLORIDE 0.9 % IV SOLP
100 mL | INTRAVENOUS | 0 refills | PRN
Start: 2022-07-17 — End: ?

## 2022-07-17 MED ORDER — RANOLAZINE 500 MG PO TB12
500 mg | Freq: Two times a day (BID) | ORAL | 0 refills | Status: AC
Start: 2022-07-17 — End: ?
  Administered 2022-07-18 (×2): 500 mg via ORAL

## 2022-07-17 MED ORDER — HYDRALAZINE 100 MG PO TAB
100 mg | Freq: Two times a day (BID) | ORAL | 0 refills | Status: AC
Start: 2022-07-17 — End: ?
  Administered 2022-07-18 (×2): 100 mg via ORAL

## 2022-07-17 NOTE — ED Notes
51 yo female to ED42 for SOA. Pt reports she has felt increasingly SOA x 1 week. Pt reports she has missed ~7 dialysis treatments due to ride issues and has significant fluid retention in BLE due to this. She reports the SOA continues to get worse and is particularly worse when laying down. She reports the swelling in BLE is painful as well. She has a mild HA. Denies vision changes. A&OX4, sitting on cart, breathing even/unlabored, NAD noted.

## 2022-07-17 NOTE — Procedures
Name: Debra Hall            MRN: [id number]                DOB: [birth year]          Age: 51 y.o.  Admission Date: 07/17/2022             LOS: 0 days    Hemodialysis Procedure Report    Report received from Primary RN, Amani at 1458  Hemodialysis treatment performed at [facility]  Patient arrived to Unit at 1545 ED Nurse via [place].  Patient ID and consent verified: Yes   Lab and orders reviewed: Yes .  Patient attached to Cardiac, NIBP, SpO2 and Critline  Access:AV Graft on left side.  Patient cannulated without difficulty, needles taped and secured.  Comment/Event: dialysis initiated without difficulty, will monitor during treatment and report changes    TREATMENT START TIME: 1555  Prescribed treatment parameters achieved at treatment start.  Patient's face uncovered and in view: Yes .  Lines and access secure, in view and intact: Yes .  Dr. Anabel Bene notified at  patient is on treatment and estimated time off.  Nephrologist at patient's bedside at 1615  - per Dr. Anabel Bene, decrease BFR to 300 ml/min and decrease DFR to 500 ml/min  Symptoms or events during treatment: during the last 5 minutes of treatment, cramping, relieved after blood was returned      TREATMENT END TIME: 1855  Net UF: 3.8 liters  End weight: 71.1 kg per standing.  Needles pulled one at a time., Hemostasis achieved in 10 minutes. and Dressed with gauze and paper tape.  Advised to remove dressing after 4-6 hours.    Report given to primary care RN Opal Sidles at 1905  Patient left unit via Wheelchair with Transporter.    No further questions or concerns at this time.    See Hemodialysis flowsheet and MAR for further details.

## 2022-07-17 NOTE — Consults
General Consult Note      Admission Date: 07/17/2022                                                LOS: 0 days    Reason for Consult:  Asked by Dr Marlou Starks to see 51 yo F with ESRD  07/17/2022 12:30 PM   SOB (shortness of breath) [R06.02]   Consult type: Co-Management w/Signed Orders    Assessment/Plan   ESRD, missed HD txs  Anemia--pt is on ESA/iron protocol at outpt unit  MBD  HTN  pulm edema    Recs:  --emergent HD now for careful clearance (low BFR, DFR) and fluid removal as tol  --will run again tomorrow  --will resume ESA  --cont phos binder    Dx/px/tx options d/w pt at length; all questions asnwered. Risks to health/life posed by failing to comply with HD Rx d/w pt at length.   ______________________________________________________________________    History of Present Illness: Debra Hall is a 51 y.o. female with HTN, MS, bipolar d/o. Pt seen and examined on HD this aftenroon. Presented to [place] ER with many d hx increasing sob. Missed last 7 HD txs d/t ride probs.     Pt states she has been on HD for past 3 mo. Dialyzes in [place], [place]. Was told by kidney care nurse to come to [place]. Pt insists she has been working with people to get ride sorted out. Was told she was not a good canddiate for home dialysis d/t BP probs.   Pt states she is on transplant list her.     Medical History:   Diagnosis Date   ? Adverse drug reaction    ? Bipolar disorder (HCC)    ? Depression 05/07/2009   ? Diverticulitis    ? Essential hypertension 05/07/2009   ? Frequent UTI 05/07/2009   ? Hyperlipemia    ? Hypertension 05/07/2009   ? Lung mass    ? Multiple sclerosis (HCC) 05/07/2009   ? 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 Renal failure    ? Volume overload 06/29/2018     Surgical History:   Procedure Laterality Date   ? ANGIOGRAPHY RENAL ARTERY - BILATERAL WITH POSSIBLE STENT PLACEMENT Bilateral 05/22/2018    Performed by Greig Castilla, MD at [facility] CATH LAB   ? ANGIOGRAPHY CORONARY ARTERY WITH LEFT HEART CATHETERIZATION N/A 06/05/2018    Performed by Laney Pastor, MD at [facility] CATH LAB   ? PERCUTANEOUS CORONARY STENT PLACEMENT WITH ANGIOPLASTY N/A 06/05/2018    Performed by Laney Pastor, MD at [facility] CATH LAB   ? CORONARY ARTERY BYPASS WITH ARTERIAL GRAFT - 6 GRAFTS (Internal Mammary Artery and Endovascular Vein Harvest) N/A 06/11/2018    Performed by Collene Schlichter, MD at [facility] CVOR   ? ANGIOGRAPHY CORONARY ARTERY WITH LEFT HEART CATHETERIZATION N/A 05/09/2022    Performed by Julienne Kass, MD at [facility] CATH LAB   ? PERCUTANEOUS CORONARY STENT PLACEMENT WITH ANGIOPLASTY N/A 05/09/2022    Performed by Julienne Kass, MD at [facility] CATH LAB   ? ABDOMINAL EXPLORATION SURGERY      Patient reports this surgery was done for ruptured diverticuli   ? CARDIAC CATHERIZATION     ? CHOLECYSTECTOMY     ? DOPPLER ECHOCARDIOGRAPHY     ? ELECTROCARDIOGRAM     ?  HX OOPHORECTOMY Right    ? HX TUBAL LIGATION       Social History     Tobacco Use   ? Smoking status: Former     Packs/day: 1.00     Years: 35.00     Additional pack years: 0.00     Total pack years: 35.00     Types: Cigarettes     Quit date: 08/15/2018     Years since quitting: 3.9   ? Smokeless tobacco: Never   Vaping Use   ? Vaping Use: Never used   Substance Use Topics   ? Alcohol use: No   ? Drug use: No     Family History   Problem Relation Age of Onset   ? Coronary Artery Disease Mother    ? Heart Disease Mother    ? Heart Attack Mother 87   ? Cancer Father         Colon Cancer   ? Heart Surgery Sister 79   ? Heart Disease Sister    ? Heart Attack Sister    ? Heart Surgery Brother 50        CABG   ? Heart Disease Brother    ? Heart Attack Maternal Grandmother    ? Stroke Maternal Grandmother    ? Heart Disease Maternal Grandmother    ? Heart Disease Brother    ? Heart Surgery Brother 83   ? SLE Sister    ? SLE Other    ? Other Maternal Uncle         Polio   ? Multiple sclerosis Neg Hx      Allergies:  Ibuprofen, Nicardipine, Penicillins, Aspirin, Cephalosporins, Ciprofloxacin, Iodinated contrast media, and Olmesartan    Scheduled Meds:albuterol sulfate (PROAIR HFA) inhaler 2 puff, 2 puff, Inhalation, TID & PRN  amLODIPine (NORVASC) tablet 10 mg, 10 mg, Oral, QDAY  aspirin EC (ASPIR-LOW) tablet 81 mg, 81 mg, Oral, QDAY  atorvastatin (LIPITOR) tablet 80 mg, 80 mg, Oral, QHS  calcitrioL (ROCALTROL) capsule 0.25 mcg, 0.25 mcg, Oral, QDAY  carvediloL (COREG) tablet 12.5 mg, 12.5 mg, Oral, BID  CHOLEcalciferoL (vitamin D3) tablet 2,000 Units, 2,000 Units, Oral, QDAY  clopiDOGreL (PLAVIX) tablet 75 mg, 75 mg, Oral, QDAY  gabapentin (NEURONTIN) capsule 300 mg, 300 mg, Oral, QHS  hydrALAZINE (APRESOLINE) tablet 100 mg, 100 mg, Oral, BID  loratadine (CLARITIN) tablet 5 mg, 5 mg, Oral, QDAY  pantoprazole DR (PROTONIX) tablet 40 mg, 40 mg, Oral, BID  prazosin (MINIPRESS) capsule 1 mg, 1 mg, Oral, QHS  ranolazine ER (RANEXA) tablet 500 mg, 500 mg, Oral, BID  senna (SENOKOT) tablet 1 tablet, 1 tablet, Oral, BID  sertraline (ZOLOFT) tablet 100 mg, 100 mg, Oral, QDAY    Continuous Infusions:  ? nitroGLYCERIN 50 mg/D5W 250 mL infusion 0.2 mcg/kg/min (07/17/22 1534)   ? sodium chloride 0.9 %   infusion     ? sodium chloride 0.9 %   infusion     ? sodium chloride 0.9 %   infusion       PRN and Respiratory Meds:cyclobenzaprine TID PRN, lidocaine PF Once PRN, polyethylene glycol 3350 QDAY PRN, sodium chloride 0.9% (NS) IP Dialysis PRN, sodium chloride 0.9% (NS) IP Dialysis PRN, sodium chloride 0.9% (NS) PRN, traZODone QHS PRN    Patient's Medications   New Prescriptions    No medications on file   Previous Medications    ALBUTEROL SULFATE (PROAIR HFA) 90 MCG/ACTUATION HFA AEROSOL INHALER    Inhale two puffs by mouth  into the lungs every 6 hours as needed for Wheezing or Shortness of Breath. Shake well before use.    AMLODIPINE (NORVASC) 10 MG TABLET    Take one tablet by mouth daily.    ASPIRIN EC 81 MG TABLET    Take one tablet by mouth daily. Take with food.    ATORVASTATIN (LIPITOR) 80 MG TABLET    Take one tablet by mouth at bedtime daily.    CALCITRIOL (ROCALTROL) 0.25 MCG CAPSULE    Take one capsule by mouth daily.    CARVEDILOL (COREG) 12.5 MG TABLET    Take one tablet by mouth twice daily. Take with food.    CHOLECALCIFEROL (VITAMIN D3) (VITAMIN D3) 1,000 UNITS TABLET    Take two tablets by mouth daily.    CLOPIDOGREL (PLAVIX) 75 MG TABLET    Take one tablet by mouth daily.    CYCLOBENZAPRINE (FLEXERIL) 5 MG TABLET    Take one tablet by mouth three times daily as needed.    EPINEPHRINE (EPIPEN) 1 MG/ML INJECTION PEN (2-PACK)    Inject 0.3 mL into the muscle once as needed. Inject 0.3 mg (1 Pen) into thigh if needed for anaphylactic reaction. May repeat in 5-15 minutes if needed.    FEXOFENADINE (ALLEGRA) 60 MG TABLET    Take one tablet by mouth daily.    GABAPENTIN (NEURONTIN) 300 MG CAPSULE    Take one capsule by mouth at bedtime daily.    HYDRALAZINE (APRESOLINE) 100 MG TABLET    Take one tablet by mouth twice daily.    HYDROCODONE/ACETAMINOPHEN (NORCO) 5/325 MG TABLET    Take one tablet by mouth every 6 hours as needed for Pain.    LABETALOL (NORMODYNE) 100 MG TABLET    Take one tablet by mouth twice daily.    LORATADINE (CLARITIN) 10 MG TABLET    Take one tablet by mouth daily.    LORAZEPAM (ATIVAN) 1 MG TABLET    Take one tablet by mouth twice daily as needed (anxiety).    MULTIVIT WITH CALCIUM,IRON,MIN (WOMEN'S DAILY MULTIVITAMIN PO)    Take 1 tablet by mouth daily.    NITROGLYCERIN (NITROSTAT) 0.4 MG TABLET    DISSOLVE ONE TABLET UNDER TONGUE EVERY FIVE MINUTES AS NEEDED FOR CHEST PAIN. *DO NOT EXCEED A TOTAL OF THREE DOSES IN 15 MINUTES. CALL 911 IF CHEST PAIN PERSISTS AFTER THREE DOSES*    ONDANSETRON (ZOFRAN ODT) 4 MG RAPID DISSOLVE TABLET        PANTOPRAZOLE DR (PROTONIX) 40 MG TABLET    Take one tablet by mouth twice daily.    POLYETHYLENE GLYCOL 3350 (MIRALAX) 17 G PACKET    Take one packet by mouth daily as needed (constipation).    PRAZOSIN (MINIPRESS) 1 MG CAPSULE Take one capsule by mouth at bedtime daily.    RANOLAZINE ER (RANEXA) 500 MG TABLET    Take one tablet by mouth twice daily.    SENNA (SENOKOT) 8.6 MG TABLET    Take one tablet by mouth twice daily.    SERTRALINE (ZOLOFT) 100 MG TABLET    Take one tablet by mouth daily.    TRAZODONE (DESYREL) 50 MG TABLET    Take one tablet by mouth at bedtime as needed for Sleep.   Modified Medications    No medications on file   Discontinued Medications    MIRABEGRON (MYRBETRIQ ER) 25 MG TABLET    Take one tablet by mouth daily.    SODIUM BICARBONATE 650 MG TABLET    Take  one tablet by mouth three times daily.      Review of Systems:  14 pt ros neg except as below:  Subjective fever last couple of days  +sob, orth  Mild n,  No vom  No urin sxs    Vital Signs:  Last Filed in 24 hours Vital Signs:  24 hour Range    BP: 199/114 (12/03 1530)  Temp: 36.5 ?C (97.7 ?F) (12/03 1220)  Pulse: 99 (12/03 1530)  Respirations: 18 PER MINUTE (12/03 1530)  SpO2: 93 % (12/03 1530)  O2 Device: None (Room air) (12/03 1220) BP: (183-221)/(104-150)   Temp:  [36.5 ?C (97.7 ?F)]   Pulse:  [90-107]   Respirations:  [14 PER MINUTE-20 PER MINUTE]   SpO2:  [91 %-98 %]   O2 Device: None (Room air)   No intake or output data in the 24 hours ending 07/17/22 1615   Physical Exam:  Alert, nad  Nc/at  anict  Oral mucosa dry  TMM  No cerv LA  RRR +S4  No crackels ant/lat  abd s, nt  2+ LE edema  Moves all extrem  Answers questions appropriately  No rash on expowsed skin      Lab/Radiology/Other Diagnostic Tests:  Results for orders placed or performed during the hospital encounter of 07/17/22 (from the past 48 hour(s))   HIGH SENSITIVITY TROPONIN I 0 HOUR    Collection Time: 07/17/22  1:04 PM   # # Low-High    hs Troponin I 0 Hour 59 (H) <12 ng/L   CBC AND DIFF    Collection Time: 07/17/22  1:04 PM   # # Low-High    White Blood Cells 6.2 4.5 - 11.0 K/UL    RBC 2.84 (L) 4.0 - 5.0 M/UL    Hemoglobin 8.6 (L) 12.0 - 15.0 GM/DL    Hematocrit 16.1 (L) 36 - 45 % MCV 86.7 80 - 100 FL    MCH 30.2 26 - 34 PG    MCHC 34.8 32.0 - 36.0 G/DL    RDW 09.6 (H) 11 - 15 %    Platelet Count 225 150 - 400 K/UL    MPV 7.1 7 - 11 FL    Neutrophils 63 41 - 77 %    Lymphocytes 26 24 - 44 %    Monocytes 8 4 - 12 %    Eosinophils 3 0 - 5 %    Basophils 0 0 - 2 %    Absolute Neutrophil Count 3.90 1.8 - 7.0 K/UL    Absolute Lymph Count 1.60 1.0 - 4.8 K/UL    Absolute Monocyte Count 0.52 0 - 0.80 K/UL    Absolute Eosinophil Count 0.17 0 - 0.45 K/UL    Absolute Basophil Count 0.02 0 - 0.20 K/UL    MDW (Monocyte Distribution Width) 18.4 <20.7   COMPREHENSIVE METABOLIC PANEL    Collection Time: 07/17/22  1:04 PM   # # Low-High    Sodium 141 137 - 147 MMOL/L    Potassium 3.3 (L) 3.5 - 5.1 MMOL/L    Chloride 108 98 - 110 MMOL/L    Glucose 91 70 - 100 MG/DL    Blood Urea Nitrogen 78 (H) 7 - 25 MG/DL    Creatinine 0.45 (H) 0.4 - 1.00 MG/DL    Calcium 7.5 (L) 8.5 - 10.6 MG/DL    Total Protein 6.9 6.0 - 8.0 G/DL    Total Bilirubin 0.3 0.3 - 1.2 MG/DL    Albumin 3.8 3.5 -  5.0 G/DL    Alk Phosphatase 454 (H) 25 - 110 U/L    AST (SGOT) 14 7 - 40 U/L    CO2 17 (L) 21 - 30 MMOL/L    ALT (SGPT) 8 7 - 56 U/L    Anion Gap 16 (H) 3 - 12    eGFR 5 (L) >60 mL/min   MAGNESIUM    Collection Time: 07/17/22  1:04 PM   # # Low-High    Magnesium 2.1 1.6 - 2.6 mg/dL   NT-PRO-BNP    Collection Time: 07/17/22  1:04 PM   # # Low-High    NT-Pro-BNP 36,256.0 (H) <125 pg/mL   COVID-19 (SARS-COV-2) PCR    Collection Time: 07/17/22  1:04 PM    Specimen: Nasopharyngeal; Flocked Swab   # # Low-High    COVID-19 (SARS-CoV-2) PCR Source FLOCKED SWAB  NASOPHARYNGEAL       COVID-19 (SARS-CoV-2) PCR NOT DETECTED DN-NOT DETECTED   INFLUENZA A/B AND RSV PCR    Collection Time: 07/17/22  1:04 PM    Specimen: Nasopharyngeal; Flocked Swab   # # Low-High    Influenza A Virus NEG NEG-NEG    Influenza B Virus NEG NEG-NEG    RSV NEG NEG-NEG   POC BLOOD GAS VEN    Collection Time: 07/17/22  1:04 PM   # # Low-High    PH-VEN-POC 7.30 7.30 - 7.40 PCO2-VEN-POC 35 (L) 36 - 50 MMHG    PO2-VEN-POC 36 33 - 48 MMHG    Base Def-VEN-POC 9.0 MMOL/L    O2 Sat-VEN-POC 63.0 55 - 71 %    Bicarbonate-VEN-POC 17.1 MMOL/L   POC HEMATOCRIT    Collection Time: 07/17/22  1:04 PM   # # Low-High    Hemoglobin POC 8.2 (L) 12.0 - 15.0 GM/DL    Hematocrit POC 09.8 (L) 36 - 45 %   POC POTASSIUM    Collection Time: 07/17/22  1:04 PM   # # Low-High    Potassium-POC 3.4 (L) 3.5 - 5.1 MMOL/L   POC SODIUM    Collection Time: 07/17/22  1:04 PM   # # Low-High    Sodium-POC 141 137 - 147 MMOL/L   HIGH SENSITIVITY TROPONIN I 2 HOUR    Collection Time: 07/17/22  2:47 PM   # # Low-High    hs Troponin I 2 Hour 48 (H) <12 ng/L      @KULASTRAD @

## 2022-07-17 NOTE — H&P (View-Only)
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 Severe native 3-vessel disease.  2. Patent coronary artery bypass graft including LIMA to LAD, vein graft to 1st and 2nd obtuse marginal branch, vein graft to diagonal branch and vein graft to right posterior descending artery and left posterolateral branches.  3. Severe stenosis of mid to distal LAD, distal to insertion of LIMA to LAD bypass graft.  4. Elevated left ventricular end-diastolic pressure.  5. No significant transaortic gradient on pullback.  - continue aspirin - lifelong, atorvastatin, ranolazine  - continue plavix daily (started 05/09/22) for at least 1 year  - continue coreg and amlodipine  ??  Bipolar Disorder  Depression  PTSD  - stable  -?Continue PTA?Zoloft 100 mg daily, prazosin 1 mg qhs, and trazodone 50 mg nightly as needed  ?  FEN: renal diet  Code Status: Full Code     Discussed with ED attending and/or resident     High medical decision making due to the following:  1. Decision regarding hospitalization  2. Review of notes outside of my specialty, Review of each unique test and Ordering of each unique test  ______________________________________________________________________________    Primary Care Physician: Gardiner Barefoot     Chief Complaint: SOB    History of Present Illness: Debra Hall is a 51 y.o. female with PMH as bellow who comes to ED for SOB after 2 weeks of missing HD. Patient is A&O x 4 on admission and she reports she has been missing HD for 2-3 weeks due to transportation difficulties related to her insurance so she has been retaining too much fluid lately. She feels SOB and unable to be supine. She reports legs swelling, abdominal distension and SOB worse w/ lying down and minimal activity. She denies fever/chills, cough, palpitations, LOC,any active bleeding. She reports intermittent right CP worse w/ activity, denies CP on admisison. Other ROS reviewed and negative.    Medical History:   Diagnosis Date   ? Adverse drug reaction    ? Bipolar disorder (HCC)    ? Depression 05/07/2009   ? Diverticulitis    ? Essential hypertension 05/07/2009   ? Frequent UTI 05/07/2009   ? Hyperlipemia    ? Hypertension 05/07/2009   ?  Lung mass    ? Multiple sclerosis (HCC) 05/07/2009   ? 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 Renal failure    ? Volume overload 06/29/2018     Surgical History:   Procedure Laterality Date   ? ANGIOGRAPHY RENAL ARTERY - BILATERAL WITH POSSIBLE STENT PLACEMENT Bilateral 05/22/2018    Performed by Greig Castilla, MD at [facility] CATH LAB   ? ANGIOGRAPHY CORONARY ARTERY WITH LEFT HEART CATHETERIZATION N/A 06/05/2018    Performed by Laney Pastor, MD at [facility] CATH LAB   ? PERCUTANEOUS CORONARY STENT PLACEMENT WITH ANGIOPLASTY N/A 06/05/2018    Performed by Laney Pastor, MD at [facility] CATH LAB   ? CORONARY ARTERY BYPASS WITH ARTERIAL GRAFT - 6 GRAFTS (Internal Mammary Artery and Endovascular Vein Harvest) N/A 06/11/2018    Performed by Collene Schlichter, MD at [facility] CVOR   ? ANGIOGRAPHY CORONARY ARTERY WITH LEFT HEART CATHETERIZATION N/A 05/09/2022    Performed by Julienne Kass, MD at [facility] CATH LAB   ? PERCUTANEOUS CORONARY STENT PLACEMENT WITH ANGIOPLASTY N/A 05/09/2022    Performed by Julienne Kass, MD at [facility] CATH LAB   ? ABDOMINAL EXPLORATION SURGERY      Patient reports this surgery was done for ruptured diverticuli   ? CARDIAC CATHERIZATION     ? CHOLECYSTECTOMY     ? DOPPLER ECHOCARDIOGRAPHY     ? ELECTROCARDIOGRAM     ? HX OOPHORECTOMY Right    ? HX TUBAL LIGATION       Family History   Problem Relation Age of Onset   ? Coronary Artery Disease Mother    ? Heart Disease Mother    ? Heart Attack Mother 56   ? Cancer Father         Colon Cancer   ? Heart Surgery Sister 55   ? Heart Disease Sister    ? Heart Attack Sister    ? Heart Surgery Brother 29        CABG   ? Heart Disease Brother    ? Heart Attack Maternal Grandmother    ? Stroke Maternal Grandmother    ? Heart Disease Maternal Grandmother    ? Heart Disease Brother    ? Heart Surgery Brother 69   ? SLE Sister    ? SLE Other    ? Other Maternal Uncle         Polio   ? Multiple sclerosis Neg Hx      Social History     Socioeconomic History   ? Marital status: Single   ? Number of children: 4   ? Years of education: 41   ? Highest education level: High school graduate   Occupational History   ? Occupation: disabled   Tobacco Use   ? Smoking status: Former     Packs/day: 1.00     Years: 35.00     Additional pack years: 0.00     Total pack years: 35.00     Types: Cigarettes     Quit date: 08/15/2018     Years since quitting: 3.9   ? Smokeless tobacco: Never   Vaping Use   ? Vaping Use: Never used   Substance and Sexual Activity   ? Alcohol use: No   ? Drug use: No      Immunizations (includes history and patient reported):   Immunization History   Administered Date(s) Administered   ? COVID-19 (MODERNA), mRNA vacc, 100 mcg/0.5 mL (PF) 11/23/2020,  12/21/2020   ? Covid-19 Bivalent (72yr+)(MODERNA), mRNA Vacc, 50 mcg/0.5 mL (PF) 06/25/2021   ? Flu Vaccine =>6 Months Quadrivalent PF 05/19/2018, 05/10/2022   ? Pneumococcal Vaccine (23-Val Adult) 08/15/2015           Allergies:  Ibuprofen, Nicardipine, Penicillins, Aspirin, Cephalosporins, Ciprofloxacin, Iodinated contrast media, and Olmesartan    Medications:  (Not in a hospital admission)    Review of Systems:  A comprehensive  12 point review of organ systems reviewed and was negative except for the ones mentioned in HOPI    Physical Exam:  Vital Signs: Last Filed In 24 Hours Vital Signs: 24 Hour Range   BP: 186/104 (12/03 1430)  Temp: 36.5 ?C (97.7 ?F) (12/03 1220)  Pulse: 93 (12/03 1430)  Respirations: 16 PER MINUTE (12/03 1430)  SpO2: 91 % (12/03 1430)  O2 Device: None (Room air) (12/03 1220) BP: (183-221)/(104-150)   Temp:  [36.5 ?C (97.7 ?F)]   Pulse:  [90-107]   Respirations:  [14 PER MINUTE-20 PER MINUTE]   SpO2:  [91 %-98 %]   O2 Device: None (Room air)   Intensity Pain Scale (Self Report): 8 (07/17/22 1221)      General:  Alert, awake, oriented x 3 , cooperative, no distress  Head:  Normocephalic, without obvious abnormality, atraumatic  Eyes:  Conjunctivae/corneas clear   Nose: Nares normal. Mucosa normal.  No drainage or sinus tenderness  Throat: Lips, mucosa and tongue normal  Neck:    Supple, symmetrical, trachea midline  Lungs:  Decreased BS bilaterally  Heart:   Regular rate and rhythm, S1, S2 normal, no murmur  Abdomen:  Soft, non-tender.  Bowel sounds normal.    Extremities: Extremities normal, atraumatic, no cyanosis, ++ legs edema  Skin: Skin color, texture, turgor normal.    Neurologic: Non focal grossly    Lab/Radiology/Other Diagnostic Tests:  24-hour labs:    Results for orders placed or performed during the hospital encounter of 07/17/22 (from the past 24 hour(s))   HIGH SENSITIVITY TROPONIN I 0 HOUR    Collection Time: 07/17/22  1:04 PM   Result Value Ref Range    hs Troponin I 0 Hour 59 (H) <12 ng/L   CBC AND DIFF    Collection Time: 07/17/22  1:04 PM   Result Value Ref Range    White Blood Cells 6.2 4.5 - 11.0 K/UL    RBC 2.84 (L) 4.0 - 5.0 M/UL    Hemoglobin 8.6 (L) 12.0 - 15.0 GM/DL    Hematocrit 16.1 (L) 36 - 45 %    MCV 86.7 80 - 100 FL    MCH 30.2 26 - 34 PG    MCHC 34.8 32.0 - 36.0 G/DL    RDW 09.6 (H) 11 - 15 %    Platelet Count 225 150 - 400 K/UL    MPV 7.1 7 - 11 FL    Neutrophils 63 41 - 77 %    Lymphocytes 26 24 - 44 %    Monocytes 8 4 - 12 %    Eosinophils 3 0 - 5 %    Basophils 0 0 - 2 %    Absolute Neutrophil Count 3.90 1.8 - 7.0 K/UL    Absolute Lymph Count 1.60 1.0 - 4.8 K/UL    Absolute Monocyte Count 0.52 0 - 0.80 K/UL    Absolute Eosinophil Count 0.17 0 - 0.45 K/UL    Absolute Basophil Count 0.02 0 - 0.20 K/UL    MDW (Monocyte Distribution Width) 18.4 <  20.7   COMPREHENSIVE METABOLIC PANEL    Collection Time: 07/17/22  1:04 PM   Result Value Ref Range    Sodium 141 137 - 147 MMOL/L    Potassium 3.3 (L) 3.5 - 5.1 MMOL/L    Chloride 108 98 - 110 MMOL/L    Glucose 91 70 - 100 MG/DL    Blood Urea Nitrogen 78 (H) 7 - 25 MG/DL    Creatinine 1.61 (H) 0.4 - 1.00 MG/DL    Calcium 7.5 (L) 8.5 - 10.6 MG/DL    Total Protein 6.9 6.0 - 8.0 G/DL    Total Bilirubin 0.3 0.3 - 1.2 MG/DL    Albumin 3.8 3.5 - 5.0 G/DL    Alk Phosphatase 096 (H) 25 - 110 U/L    AST (SGOT) 14 7 - 40 U/L    CO2 17 (L) 21 - 30 MMOL/L    ALT (SGPT) 8 7 - 56 U/L    Anion Gap 16 (H) 3 - 12    eGFR 5 (L) >60 mL/min   MAGNESIUM    Collection Time: 07/17/22  1:04 PM   Result Value Ref Range    Magnesium 2.1 1.6 - 2.6 mg/dL   NT-PRO-BNP    Collection Time: 07/17/22  1:04 PM   Result Value Ref Range    NT-Pro-BNP 36,256.0 (H) <125 pg/mL   POC BLOOD GAS VEN    Collection Time: 07/17/22  1:04 PM   Result Value Ref Range    PH-VEN-POC 7.30 7.30 - 7.40    PCO2-VEN-POC 35 (L) 36 - 50 MMHG    PO2-VEN-POC 36 33 - 48 MMHG    Base Def-VEN-POC 9.0 MMOL/L    O2 Sat-VEN-POC 63.0 55 - 71 %    Bicarbonate-VEN-POC 17.1 MMOL/L   POC HEMATOCRIT    Collection Time: 07/17/22  1:04 PM   Result Value Ref Range    Hemoglobin POC 8.2 (L) 12.0 - 15.0 GM/DL    Hematocrit POC 04.5 (L) 36 - 45 %   POC POTASSIUM    Collection Time: 07/17/22  1:04 PM   Result Value Ref Range    Potassium-POC 3.4 (L) 3.5 - 5.1 MMOL/L   POC SODIUM    Collection Time: 07/17/22  1:04 PM   Result Value Ref Range    Sodium-POC 141 137 - 147 MMOL/L     Glucose: 91 (07/17/22 1304)  Pertinent radiology reviewed.    CHEST SINGLE VIEW    Result Date: 07/17/2022  Improved right lower lobe consolidation. Persistent right middle lobe masslike opacity with calcification.  Given prior CT report recommendations, this could be evaluated on contrast-enhanced CT chest. Calcified right hilar and mediastinal lymphadenopathy. By my electronic signature, I attest that I have personally reviewed the images for this examination and formulated the interpretations and opinions expressed in this report  Finalized by Darrick Meigs, M.D. on 07/17/2022 1:24 PM. Dictated by Nicholes Stairs, MD on 07/17/2022 12:49 PM.

## 2022-07-18 ENCOUNTER — Encounter: Admit: 2022-07-18 | Discharge: 2022-07-18 | Payer: MEDICAID | Primary: Family

## 2022-07-18 MED ADMIN — POTASSIUM CHLORIDE 20 MEQ PO TBTQ [35943]: 40 meq | ORAL | @ 19:00:00 | Stop: 2022-07-18 | NDC 00832532511

## 2022-07-18 MED ADMIN — ACETAMINOPHEN 500 MG PO TAB [102]: 1000 mg | ORAL | @ 15:00:00 | NDC 00904673061

## 2022-07-18 NOTE — Progress Notes
RT Adult Assessment Note    NAME:Debra Hall             MRN: [id number]             DOB:04-Aug-1971          AGE: 51 y.o.  ADMISSION DATE: 07/17/2022             DAYS ADMITTED: LOS: 0 days    Additional Comments:  Impressions of the patient: NAD, pt resting comfortably in bed on RA.  Intervention(s)/outcome(s): RT Evaluation  Patient education that was completed: N/A  Recommendations to the care team: N/A    Vital Signs:  Pulse:    RR:    SpO2:    O2 Device:    Liter Flow:    O2%:      Breath Sounds:      Respiratory Effort:      Comments:

## 2022-07-19 MED ADMIN — ACETAMINOPHEN/LIDOCAINE/ANTACID DS(#) 1:1:3  PO SUSP [210000]: 30 mL | ORAL | @ 01:00:00 | Stop: 2022-07-19 | NDC 54029002209

## 2022-07-20 ENCOUNTER — Encounter: Admit: 2022-07-20 | Discharge: 2022-07-20 | Payer: MEDICAID | Primary: Family

## 2022-07-20 ENCOUNTER — Ambulatory Visit: Admit: 2022-07-20 | Discharge: 2022-07-20 | Payer: MEDICARE | Primary: Family

## 2022-07-20 ENCOUNTER — Encounter: Admit: 2022-07-20 | Discharge: 2022-07-20 | Payer: MEDICARE | Primary: Family

## 2022-07-20 DIAGNOSIS — K5792 Diverticulitis of intestine, part unspecified, without perforation or abscess without bleeding: Secondary | ICD-10-CM

## 2022-07-20 DIAGNOSIS — I701 Atherosclerosis of renal artery: Secondary | ICD-10-CM

## 2022-07-20 DIAGNOSIS — I10 Essential (primary) hypertension: Secondary | ICD-10-CM

## 2022-07-20 DIAGNOSIS — Z5181 Encounter for therapeutic drug level monitoring: Secondary | ICD-10-CM

## 2022-07-20 DIAGNOSIS — T50905A Adverse effect of unspecified drugs, medicaments and biological substances, initial encounter: Secondary | ICD-10-CM

## 2022-07-20 DIAGNOSIS — N390 Urinary tract infection, site not specified: Secondary | ICD-10-CM

## 2022-07-20 DIAGNOSIS — I25118 Atherosclerotic heart disease of native coronary artery with other forms of angina pectoris: Secondary | ICD-10-CM

## 2022-07-20 DIAGNOSIS — N19 Unspecified kidney failure: Secondary | ICD-10-CM

## 2022-07-20 DIAGNOSIS — E785 Hyperlipidemia, unspecified: Secondary | ICD-10-CM

## 2022-07-20 DIAGNOSIS — R918 Other nonspecific abnormal finding of lung field: Secondary | ICD-10-CM

## 2022-07-20 DIAGNOSIS — F32A Depression: Secondary | ICD-10-CM

## 2022-07-20 DIAGNOSIS — F319 Bipolar disorder, unspecified: Secondary | ICD-10-CM

## 2022-07-20 DIAGNOSIS — E8770 Fluid overload, unspecified: Secondary | ICD-10-CM

## 2022-07-20 DIAGNOSIS — G35 Multiple sclerosis: Secondary | ICD-10-CM

## 2022-07-20 LAB — LIPID PROFILE
CHOLESTEROL: 189 mg/dL (ref <200)
TRIGLYCERIDES: 143 mg/dL (ref <150)

## 2022-07-20 LAB — BASIC METABOLIC PANEL
ANION GAP: 16 — ABNORMAL HIGH (ref 3–12)
BLD UREA NITROGEN: 54 mg/dL — ABNORMAL HIGH (ref 7–25)
CALCIUM: 8 mg/dL — ABNORMAL LOW (ref 8.5–10.6)
CHLORIDE: 101 MMOL/L (ref 98–110)
CO2: 21 MMOL/L (ref 21–30)
CREATININE: 10 mg/dL — ABNORMAL HIGH (ref 0.4–1.00)
EGFR: 4 mL/min — ABNORMAL LOW (ref >60)
GLUCOSE,PANEL: 89 mg/dL (ref 70–100)
POTASSIUM: 4 MMOL/L — ABNORMAL HIGH (ref <100)
SODIUM: 138 MMOL/L (ref >40)

## 2022-07-20 MED ORDER — AMLODIPINE 5 MG PO TAB
5 mg | ORAL_TABLET | Freq: Two times a day (BID) | ORAL | 1 refills | Status: AC
Start: 2022-07-20 — End: ?
  Filled 2022-07-20: qty 180, 90d supply, fill #1

## 2022-07-27 ENCOUNTER — Encounter: Admit: 2022-07-27 | Discharge: 2022-07-27 | Payer: MEDICARE | Primary: Family

## 2022-08-31 ENCOUNTER — Encounter: Admit: 2022-08-31 | Discharge: 2022-08-31 | Payer: MEDICARE | Primary: Family

## 2022-09-01 ENCOUNTER — Encounter: Admit: 2022-09-01 | Discharge: 2022-09-01 | Payer: MEDICARE | Primary: Family

## 2022-09-21 ENCOUNTER — Encounter: Admit: 2022-09-21 | Discharge: 2022-09-21 | Payer: MEDICARE | Primary: Family

## 2022-09-27 ENCOUNTER — Encounter: Admit: 2022-09-27 | Discharge: 2022-09-27 | Payer: MEDICARE | Primary: Family

## 2022-09-28 ENCOUNTER — Encounter: Admit: 2022-09-28 | Discharge: 2022-09-28 | Payer: MEDICARE | Primary: Family

## 2022-10-05 ENCOUNTER — Encounter: Admit: 2022-10-05 | Discharge: 2022-10-05 | Payer: MEDICARE | Primary: Family

## 2022-10-05 NOTE — Committee Review
Committee Review Note     Evaluation Date: 12/28/2020  Committee Review Date: 10/05/2022    Organ being evaluated for: Kidney    Transplant Phase: Evaluation   Transplant Status:  Deferred     Transplant Coordinator: Debra Hall  Transplant Surgeon:        Referring Physician: Bernardo Heater    Primary Diagnosis:   Secondary Diagnosis:     Committee Review Members:  Dietitian, Registered Lajuana Carry, RD   Financial Counseling and Assistance Services [facility] Mayra Delilah Shan   Nephrology Diane Dortha Kern, MD, Seth Bake, MD, Wandalee Ferdinand, MD, Corena Herter, MD, Dorna Mai, MD   Pharmacist Shon Hale, [place]   Psychiatry Vanita Panda, MD   Psychologist, Clinical Vanita Panda, MD   Social Worker [facility]   Transplant Services Anastasio Auerbach, RN, Carolee Rota, RN, Shaune Leeks, RN, Verlene Mayer, RN, Glean Salvo, RN, Velva Harman, RN, Ralph Dowdy, RN   Transplant Surgery York Cerise, MD, Nicholos Johns, MD       Transplant Eligibility: Adults age 51 or older.  A minor may be considered on an individual basis, End Stage Renal Disease as defined by CrCl or GFR of less than or equal to 20 mL/min; or currently on dialysis    Committee Review Decision: Declined    Relative Contraindications: Incomplete transplant evaluation, inconsistent patient communication    Absolute Contraindications: .    Committee Discussion Details: Deny    Action Items: Deny/close evaluation    Transplant coordinator to request: n/a    Hepatitis B immunity status: Vaccination is recommended.    Does patient have PVD?: Not applicable     Does patient have symptomatic PVD?: Not applicable    Financial Plan Submitted?: Not applicable     Financial Plan Approved?: Not applicable    Is Financial Auth Needed?: Not applicable    Valcyte brand name: NA    Valcyte generic: NA    Recommended for KDPI > 85%:  Not applicable    Recommended for [place] Kidneys: Not applicable    Recommended for 2:1 Kidney Offer: Not applicable    Does patient need to return to committee?: No    Discussion: Patient in evaluation since 12/2020, multiple psychiatric diagnoses. Patient will randomly call and leave messages for other coordinators, then primary coordinator will call her back and patient won't return calls. Patient calls sometimes to ask what she needs to do or if she is on the waitlist but has completed minimal items for transplant evaluation. Patient is difficult to get in contact with. Patient can re-refer in the future however she must have ALL items from her initial deferral completed. Committee stated patient should have all her cancer     Patient can re-refer: all cancer screenings completed, [facility] psychiatry clearance, echo/stress, off Plavix, improved functional status and pulmonology clearance for granulomas. Ensure patient has adequate support.

## 2022-10-05 NOTE — Telephone Encounter
[place] has been verified though [employer].  As of 10/05/22 patient no loner has [employer] Medicare TERMINATED 08/14/2022  No other Primary Insurance on file  Secondary [place] / [place] Ref.#[id number].

## 2022-10-05 NOTE — Telephone Encounter
Discussed in selection committee 10/05/22. Declined due to failure to progress transplant eval.   Attempted to contact pt, per automated message, she is unable to receive calls @ this time, then busy signal, unable to leave VM.   Denial letter composed and e faxed to ref phys.  Submitted hard copy for delivery to pt's home address.   Attempted contact pt's primary neph, Dr. Minna Merritts office, office closed and unable to leave VM.

## 2022-10-17 ENCOUNTER — Encounter: Admit: 2022-10-17 | Discharge: 2022-10-17 | Payer: MEDICARE | Primary: Family

## 2022-10-17 NOTE — Progress Notes
Attempted to contact patient to assist in scheduling CT Chest ordered by Dr. Cira Servant. Patient unable to receive calls, and unable to leave voicemail. Final attempt to contact patient.

## 2022-10-18 ENCOUNTER — Encounter: Admit: 2022-10-18 | Discharge: 2022-10-18 | Payer: MEDICARE | Primary: Family

## 2022-10-29 ENCOUNTER — Inpatient Hospital Stay: Payer: MEDICARE

## 2022-10-29 ENCOUNTER — Encounter: Admit: 2022-10-29 | Discharge: 2022-10-29 | Payer: MEDICARE | Primary: Family

## 2022-10-30 ENCOUNTER — Encounter: Admit: 2022-10-30 | Discharge: 2022-10-30 | Payer: MEDICARE | Primary: Family

## 2022-10-30 ENCOUNTER — Inpatient Hospital Stay: Admit: 2022-10-30 | Discharge: 2022-10-30 | Payer: MEDICARE | Primary: Family

## 2022-10-30 MED ADMIN — NITROGLYCERIN 0.4 MG SL SUBL [5604]: 0.4 mg | SUBLINGUAL | @ 16:00:00 | Stop: 2022-10-30 | NDC 59762330403

## 2022-10-30 MED ADMIN — CARVEDILOL 12.5 MG PO TAB [77424]: 12.5 mg | ORAL | @ 15:00:00 | NDC 00904630261

## 2022-10-30 MED ADMIN — CYCLOBENZAPRINE 5 MG PO TAB [35184]: 5 mg | ORAL | @ 15:00:00 | NDC 16571078201

## 2022-10-30 MED ADMIN — ACETAMINOPHEN 325 MG PO TAB [101]: 650 mg | ORAL | @ 15:00:00 | NDC 00904677361

## 2022-10-30 MED ADMIN — CYCLOBENZAPRINE 5 MG PO TAB [35184]: 5 mg | ORAL | @ 21:00:00 | NDC 16571078201

## 2022-10-30 MED ADMIN — HEPARIN (PORCINE) IN 5 % DEX 20,000 UNIT/500 ML (40 UNIT/ML) IV SOLP [3628]: 797 [IU]/h | INTRAVENOUS | @ 09:00:00 | NDC 00264956710

## 2022-10-30 MED ADMIN — HEPARIN (PORCINE) BOLUS FOR CONTINUOUS INFUSION (VIAL) - APTT MAIN [251015]: 2660 [IU] | INTRAVENOUS | @ 13:00:00 | NDC 71288040210

## 2022-10-30 MED ADMIN — LIDOCAINE (PF) 10 MG/ML (1 %) IJ SOLN [95838]: 2 mL | INTRADERMAL | @ 18:00:00 | Stop: 2022-10-30 | NDC 63323049204

## 2022-10-30 MED ADMIN — HYDRALAZINE 20 MG/ML IJ SOLN [3697]: 10 mg | INTRAVENOUS | @ 16:00:00 | Stop: 2022-10-30 | NDC 63323061400

## 2022-10-30 MED ADMIN — PANTOPRAZOLE 40 MG PO TBEC [80436]: 40 mg | ORAL | @ 15:00:00 | NDC 65862056099

## 2022-10-30 MED ADMIN — ATORVASTATIN 40 MG PO TAB [77113]: 80 mg | ORAL | @ 15:00:00 | NDC 51079021001

## 2022-10-30 MED ADMIN — RANOLAZINE 500 MG PO TB12 [95539]: 500 mg | ORAL | @ 15:00:00 | NDC 31722066860

## 2022-10-30 MED ADMIN — AMLODIPINE 10 MG PO TAB [80302]: 10 mg | ORAL | @ 15:00:00 | NDC 00904637161

## 2022-10-30 MED ADMIN — CLOPIDOGREL 75 MG PO TAB [78966]: 75 mg | ORAL | @ 15:00:00 | NDC 00904629461

## 2022-10-30 MED ADMIN — POTASSIUM CHLORIDE 20 MEQ PO TBTQ [35943]: 40 meq | ORAL | @ 09:00:00 | Stop: 2022-10-30 | NDC 00832532510

## 2022-10-30 MED ADMIN — ASPIRIN 81 MG PO TBEC [14113]: 81 mg | ORAL | @ 15:00:00 | NDC 00904675180

## 2022-10-30 MED ADMIN — SERTRALINE 100 MG PO TAB [78401]: 100 mg | ORAL | @ 15:00:00 | NDC 65862001330

## 2022-10-30 MED ADMIN — SODIUM CHLORIDE 0.9 % IV SOLP [27838]: 300 mL | INTRAVENOUS | @ 18:00:00 | Stop: 2022-10-30 | NDC 00338004904

## 2022-10-30 MED ADMIN — CALCITRIOL 0.25 MCG PO CAP [9350]: 0.25 ug | ORAL | @ 15:00:00 | NDC 60687034511

## 2022-10-30 NOTE — Progress Notes
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

## 2022-10-31 ENCOUNTER — Inpatient Hospital Stay: Admit: 2022-10-31 | Discharge: 2022-10-31 | Payer: MEDICARE | Primary: Family

## 2022-10-31 MED ADMIN — PANTOPRAZOLE 40 MG PO TBEC [80436]: 40 mg | ORAL | @ 02:00:00 | NDC 65862056099

## 2022-10-31 MED ADMIN — CARVEDILOL 12.5 MG PO TAB [77424]: 12.5 mg | ORAL | @ 18:00:00 | NDC 00904630261

## 2022-10-31 MED ADMIN — RANOLAZINE 500 MG PO TB12 [95539]: 500 mg | ORAL | @ 18:00:00 | NDC 70756070360

## 2022-10-31 MED ADMIN — ATORVASTATIN 40 MG PO TAB [77113]: 80 mg | ORAL | @ 18:00:00 | NDC 00904629261

## 2022-10-31 MED ADMIN — PERFLUTREN LIPID MICROSPHERES 1.1 MG/ML IV SUSP [79178]: 3 mL | INTRAVENOUS | @ 20:00:00 | Stop: 2022-10-31 | NDC 11994001116

## 2022-10-31 MED ADMIN — SODIUM CHLORIDE 0.9 % IV SOLP [27838]: 300 mL | INTRAVENOUS | @ 13:00:00 | Stop: 2022-10-31 | NDC 00338004904

## 2022-10-31 MED ADMIN — CLOPIDOGREL 75 MG PO TAB [78966]: 75 mg | ORAL | @ 18:00:00 | NDC 00904629461

## 2022-10-31 MED ADMIN — DEXTROSE 5% IN WATER IV SOLP [2364]: 797 [IU]/h | INTRAVENOUS | @ 20:00:00 | NDC 00338001702

## 2022-10-31 MED ADMIN — RANOLAZINE 500 MG PO TB12 [95539]: 500 mg | ORAL | @ 02:00:00 | NDC 70756070360

## 2022-10-31 MED ADMIN — LIDOCAINE (PF) 10 MG/ML (1 %) IJ SOLN [95838]: 2 mL | INTRADERMAL | @ 13:00:00 | Stop: 2022-10-31 | NDC 63323049204

## 2022-10-31 MED ADMIN — CALCITRIOL 0.25 MCG PO CAP [9350]: 0.25 ug | ORAL | @ 18:00:00 | NDC 60687034511

## 2022-10-31 MED ADMIN — ASPIRIN 81 MG PO TBEC [14113]: 81 mg | ORAL | @ 18:00:00 | NDC 00904675180

## 2022-10-31 MED ADMIN — SODIUM CHLORIDE 0.9 % IJ SOLN [7319]: 10 mL | INTRAVENOUS | @ 20:00:00 | Stop: 2022-10-31 | NDC 00409488820

## 2022-10-31 MED ADMIN — HEPARIN (PORCINE) 10,000 UNIT/ML IJ SOLN [10177]: 797 [IU]/h | INTRAVENOUS | @ 20:00:00 | NDC 63323054209

## 2022-10-31 MED ADMIN — POTASSIUM CHLORIDE 20 MEQ PO TBTQ [35943]: 40 meq | ORAL | @ 18:00:00 | Stop: 2022-10-31 | NDC 00832532510

## 2022-10-31 MED ADMIN — GABAPENTIN 300 MG PO CAP [18308]: 300 mg | ORAL | @ 02:00:00 | NDC 67877022305

## 2022-10-31 MED ADMIN — HEPARIN (PORCINE) IN 5 % DEX 20,000 UNIT/500 ML (40 UNIT/ML) IV SOLP [3628]: 797 [IU]/h | INTRAVENOUS | @ 10:00:00 | Stop: 2022-10-31 | NDC 00264956710

## 2022-10-31 MED ADMIN — CARVEDILOL 12.5 MG PO TAB [77424]: 12.5 mg | ORAL | @ 02:00:00 | NDC 00904630261

## 2022-10-31 MED ADMIN — PANTOPRAZOLE 40 MG PO TBEC [80436]: 40 mg | ORAL | @ 18:00:00 | NDC 65862056099

## 2022-10-31 MED ADMIN — SERTRALINE 100 MG PO TAB [78401]: 100 mg | ORAL | @ 18:00:00 | NDC 65862001330

## 2022-10-31 MED ADMIN — AMLODIPINE 10 MG PO TAB [80302]: 10 mg | ORAL | @ 18:00:00 | NDC 00904637161

## 2022-11-01 ENCOUNTER — Inpatient Hospital Stay: Admit: 2022-11-01 | Discharge: 2022-11-01 | Payer: MEDICARE | Primary: Family

## 2022-11-01 ENCOUNTER — Encounter: Admit: 2022-11-01 | Discharge: 2022-11-01 | Payer: MEDICARE | Primary: Family

## 2022-11-01 MED ADMIN — PREDNISONE 20 MG PO TAB [6496]: 60 mg | ORAL | @ 03:00:00 | Stop: 2022-11-02 | NDC 00904712761

## 2022-11-01 MED ADMIN — PANTOPRAZOLE 40 MG PO TBEC [80436]: 40 mg | ORAL | @ 16:00:00 | NDC 65862056099

## 2022-11-01 MED ADMIN — ASPIRIN 81 MG PO TBEC [14113]: 81 mg | ORAL | @ 13:00:00 | NDC 00904675180

## 2022-11-01 MED ADMIN — DEXTROSE 5% IN WATER IV SOLP [2364]: 797 [IU]/h | INTRAVENOUS | @ 17:00:00 | Stop: 2022-11-02 | NDC 00338001702

## 2022-11-01 MED ADMIN — CLOPIDOGREL 75 MG PO TAB [78966]: 75 mg | ORAL | @ 13:00:00 | NDC 00904629461

## 2022-11-01 MED ADMIN — AMLODIPINE 10 MG PO TAB [80302]: 10 mg | ORAL | @ 13:00:00 | NDC 00904637161

## 2022-11-01 MED ADMIN — CARVEDILOL 12.5 MG PO TAB [77424]: 12.5 mg | ORAL | @ 03:00:00 | NDC 00904630261

## 2022-11-01 MED ADMIN — RANOLAZINE 500 MG PO TB12 [95539]: 500 mg | ORAL | @ 13:00:00 | NDC 31722066860

## 2022-11-01 MED ADMIN — FAMOTIDINE 20 MG PO TAB [10011]: 20 mg | ORAL | @ 13:00:00 | Stop: 2022-11-01 | NDC 00904719361

## 2022-11-01 MED ADMIN — HEPARIN (PORCINE) 10,000 UNIT/ML IJ SOLN [10177]: 797 [IU]/h | INTRAVENOUS | @ 17:00:00 | Stop: 2022-11-02 | NDC 63323054209

## 2022-11-01 MED ADMIN — SERTRALINE 100 MG PO TAB [78401]: 100 mg | ORAL | @ 13:00:00 | NDC 65862001330

## 2022-11-01 MED ADMIN — ASPIRIN 81 MG PO CHEW [680]: 243 mg | ORAL | @ 16:00:00 | Stop: 2022-11-01 | NDC 00904679430

## 2022-11-01 MED ADMIN — RANOLAZINE 500 MG PO TB12 [95539]: 500 mg | ORAL | @ 03:00:00 | NDC 31722066860

## 2022-11-01 MED ADMIN — DIPHENHYDRAMINE HCL 50 MG PO CAP [2510]: 50 mg | ORAL | @ 13:00:00 | Stop: 2022-11-01 | NDC 00904205661

## 2022-11-01 MED ADMIN — PANTOPRAZOLE 40 MG PO TBEC [80436]: 40 mg | ORAL | @ 03:00:00 | NDC 65862056099

## 2022-11-01 MED ADMIN — GABAPENTIN 300 MG PO CAP [18308]: 300 mg | ORAL | @ 03:00:00 | NDC 67877022305

## 2022-11-01 MED ADMIN — CALCITRIOL 0.25 MCG PO CAP [9350]: 0.25 ug | ORAL | @ 13:00:00 | NDC 60687034511

## 2022-11-01 MED ADMIN — CARVEDILOL 12.5 MG PO TAB [77424]: 12.5 mg | ORAL | @ 13:00:00 | NDC 00904630261

## 2022-11-01 MED ADMIN — ATORVASTATIN 40 MG PO TAB [77113]: 80 mg | ORAL | @ 13:00:00 | NDC 00904629261

## 2022-11-01 MED ADMIN — ACETAMINOPHEN 325 MG PO TAB [101]: 650 mg | ORAL | @ 13:00:00 | NDC 00904677361

## 2022-11-01 MED ADMIN — PREDNISONE 20 MG PO TAB [6496]: 60 mg | ORAL | @ 13:00:00 | Stop: 2022-11-01 | NDC 00904712761

## 2022-11-02 ENCOUNTER — Inpatient Hospital Stay: Admit: 2022-11-02 | Discharge: 2022-11-02 | Payer: MEDICARE | Primary: Family

## 2022-11-02 ENCOUNTER — Encounter: Admit: 2022-11-02 | Discharge: 2022-11-02 | Payer: MEDICARE | Primary: Family

## 2022-11-02 MED ADMIN — ASPIRIN 81 MG PO TBEC [14113]: 81 mg | ORAL | @ 16:00:00 | Stop: 2022-11-03 | NDC 00904675180

## 2022-11-02 MED ADMIN — ISOSORBIDE MONONITRATE 30 MG PO TB24 [24521]: 30 mg | ORAL | @ 18:00:00 | Stop: 2022-11-03 | NDC 00904644961

## 2022-11-02 MED ADMIN — PANTOPRAZOLE 40 MG PO TBEC [80436]: 40 mg | ORAL | @ 16:00:00 | Stop: 2022-11-03 | NDC 65862056099

## 2022-11-02 MED ADMIN — CARVEDILOL 12.5 MG PO TAB [77424]: 12.5 mg | ORAL | @ 16:00:00 | Stop: 2022-11-03 | NDC 00904630261

## 2022-11-02 MED ADMIN — CLOPIDOGREL 75 MG PO TAB [78966]: 75 mg | ORAL | @ 16:00:00 | Stop: 2022-11-03 | NDC 00904629461

## 2022-11-02 MED ADMIN — CALCITRIOL 0.25 MCG PO CAP [9350]: 0.25 ug | ORAL | @ 16:00:00 | Stop: 2022-11-03 | NDC 60687034511

## 2022-11-02 MED ADMIN — CARVEDILOL 12.5 MG PO TAB [77424]: 12.5 mg | ORAL | @ 03:00:00 | NDC 00904630261

## 2022-11-02 MED ADMIN — SODIUM CHLORIDE 0.9 % IV SOLP [27838]: 300 mL | INTRAVENOUS | @ 12:00:00 | Stop: 2022-11-02 | NDC 00338004904

## 2022-11-02 MED ADMIN — SERTRALINE 100 MG PO TAB [78401]: 100 mg | ORAL | @ 16:00:00 | Stop: 2022-11-03 | NDC 65862001330

## 2022-11-02 MED ADMIN — RANOLAZINE 500 MG PO TB12 [95539]: 500 mg | ORAL | @ 03:00:00 | NDC 70756070360

## 2022-11-02 MED ADMIN — GABAPENTIN 300 MG PO CAP [18308]: 300 mg | ORAL | @ 03:00:00 | NDC 67877022305

## 2022-11-02 MED ADMIN — ATORVASTATIN 40 MG PO TAB [77113]: 80 mg | ORAL | @ 16:00:00 | Stop: 2022-11-03 | NDC 00904629261

## 2022-11-02 MED ADMIN — PANTOPRAZOLE 40 MG PO TBEC [80436]: 40 mg | ORAL | @ 03:00:00 | NDC 65862056099

## 2022-11-02 MED ADMIN — RANOLAZINE 500 MG PO TB12 [95539]: 500 mg | ORAL | @ 16:00:00 | Stop: 2022-11-03 | NDC 31722066860

## 2022-11-02 MED ADMIN — LIDOCAINE (PF) 10 MG/ML (1 %) IJ SOLN [95838]: 2 mL | INTRADERMAL | @ 12:00:00 | Stop: 2022-11-02 | NDC 63323049204

## 2022-11-02 MED FILL — AMLODIPINE 5 MG PO TAB: 5 mg | ORAL | 30 days supply | Qty: 30 | Fill #1 | Status: CP

## 2022-11-02 MED FILL — ISOSORBIDE MONONITRATE 30 MG PO TB24: 30 mg | ORAL | 30 days supply | Qty: 30 | Fill #1 | Status: CP

## 2022-11-02 MED FILL — CARVEDILOL 12.5 MG PO TAB: 12.5 mg | ORAL | 30 days supply | Qty: 60 | Fill #1 | Status: CP

## 2022-11-02 MED FILL — DICLOFENAC SODIUM 1 % TP GEL: 1 % | TOPICAL | 19 days supply | Qty: 300 | Fill #1 | Status: CP

## 2022-12-05 ENCOUNTER — Encounter: Admit: 2022-12-05 | Discharge: 2022-12-05 | Payer: MEDICARE | Primary: Family

## 2022-12-06 ENCOUNTER — Encounter: Admit: 2022-12-06 | Discharge: 2022-12-06 | Payer: MEDICARE

## 2022-12-06 DIAGNOSIS — E8770 Fluid overload, unspecified: Secondary | ICD-10-CM

## 2022-12-06 DIAGNOSIS — I701 Atherosclerosis of renal artery: Secondary | ICD-10-CM

## 2022-12-06 DIAGNOSIS — F319 Bipolar disorder, unspecified: Secondary | ICD-10-CM

## 2022-12-06 DIAGNOSIS — G35 Multiple sclerosis: Secondary | ICD-10-CM

## 2022-12-06 DIAGNOSIS — I160 Hypertensive urgency: Secondary | ICD-10-CM

## 2022-12-06 DIAGNOSIS — I150 Renovascular hypertension: Secondary | ICD-10-CM

## 2022-12-06 DIAGNOSIS — I25118 Atherosclerotic heart disease of native coronary artery with other forms of angina pectoris: Secondary | ICD-10-CM

## 2022-12-06 DIAGNOSIS — E785 Hyperlipidemia, unspecified: Secondary | ICD-10-CM

## 2022-12-06 DIAGNOSIS — K5792 Diverticulitis of intestine, part unspecified, without perforation or abscess without bleeding: Secondary | ICD-10-CM

## 2022-12-06 DIAGNOSIS — N390 Urinary tract infection, site not specified: Secondary | ICD-10-CM

## 2022-12-06 DIAGNOSIS — T50905A Adverse effect of unspecified drugs, medicaments and biological substances, initial encounter: Secondary | ICD-10-CM

## 2022-12-06 DIAGNOSIS — F32A Depression: Secondary | ICD-10-CM

## 2022-12-06 DIAGNOSIS — R918 Other nonspecific abnormal finding of lung field: Secondary | ICD-10-CM

## 2022-12-06 DIAGNOSIS — I10 Essential (primary) hypertension: Secondary | ICD-10-CM

## 2022-12-06 DIAGNOSIS — Z951 Presence of aortocoronary bypass graft: Secondary | ICD-10-CM

## 2022-12-06 DIAGNOSIS — N19 Unspecified kidney failure: Secondary | ICD-10-CM

## 2022-12-06 MED ORDER — AMLODIPINE 10 MG PO TAB
10 mg | ORAL_TABLET | Freq: Every day | ORAL | 3 refills | Status: AC
Start: 2022-12-06 — End: ?

## 2022-12-07 MED ORDER — AMLODIPINE 10 MG PO TAB
10 mg | ORAL_TABLET | Freq: Every day | ORAL | 3 refills | Status: AC
Start: 2022-12-07 — End: ?
  Filled 2022-12-06: qty 90, 90d supply, fill #1

## 2022-12-20 ENCOUNTER — Encounter: Admit: 2022-12-20 | Discharge: 2022-12-20 | Payer: MEDICARE

## 2022-12-29 ENCOUNTER — Encounter: Admit: 2022-12-29 | Discharge: 2022-12-29 | Payer: MEDICARE

## 2023-01-01 ENCOUNTER — Encounter: Admit: 2023-01-01 | Discharge: 2023-01-01 | Payer: MEDICARE

## 2023-01-01 NOTE — Progress Notes
Lipid Management Review    LDL   Date Value Ref Range Status   07/20/2022 101 (H) <100 mg/dL Final   57/84/6962 77 <952 mg/dL Final   84/13/2440 43  Final     Calcium Score Date & Result: N/A  Current lipid lowering medications: atorvastatin 80mg  daily  Previous lipid lowering medications tried/failed: N/A  Next OV: 06/20/23    Action Items: Patient to get testing completed

## 2023-01-18 ENCOUNTER — Encounter: Admit: 2023-01-18 | Discharge: 2023-01-18 | Payer: MEDICARE

## 2023-02-20 ENCOUNTER — Encounter: Admit: 2023-02-20 | Discharge: 2023-02-20 | Payer: MEDICARE

## 2023-03-16 DEATH — deceased

## 2023-03-24 ENCOUNTER — Encounter: Admit: 2023-03-24 | Discharge: 2023-03-24 | Payer: MEDICARE
